# Patient Record
Sex: Female | Born: 1945
Health system: Southern US, Community
[De-identification: ages and names within clinical notes are randomized; demographics above are authoritative.]

## PROBLEM LIST (undated history)

## (undated) DIAGNOSIS — F419 Anxiety disorder, unspecified: Secondary | ICD-10-CM

## (undated) DIAGNOSIS — R350 Frequency of micturition: Secondary | ICD-10-CM

## (undated) DIAGNOSIS — E119 Type 2 diabetes mellitus without complications: Secondary | ICD-10-CM

## (undated) DIAGNOSIS — I10 Essential (primary) hypertension: Secondary | ICD-10-CM

## (undated) DIAGNOSIS — E039 Hypothyroidism, unspecified: Secondary | ICD-10-CM

## (undated) DIAGNOSIS — Z8709 Personal history of other diseases of the respiratory system: Secondary | ICD-10-CM

## (undated) DIAGNOSIS — R32 Unspecified urinary incontinence: Secondary | ICD-10-CM

## (undated) DIAGNOSIS — M1990 Unspecified osteoarthritis, unspecified site: Secondary | ICD-10-CM

## (undated) DIAGNOSIS — F32A Depression, unspecified: Secondary | ICD-10-CM

## (undated) DIAGNOSIS — Z8701 Personal history of pneumonia (recurrent): Secondary | ICD-10-CM

## (undated) DIAGNOSIS — F329 Major depressive disorder, single episode, unspecified: Secondary | ICD-10-CM

## (undated) DIAGNOSIS — E785 Hyperlipidemia, unspecified: Secondary | ICD-10-CM

## (undated) DIAGNOSIS — I639 Cerebral infarction, unspecified: Secondary | ICD-10-CM

## (undated) HISTORY — PX: TUMOR EXCISION: SHX421

## (undated) HISTORY — PX: ELBOW SURGERY: SHX618

## (undated) HISTORY — PX: KNEE ARTHROPLASTY: SHX992

## (undated) HISTORY — PX: SHOULDER SURGERY: SHX246

## (undated) HISTORY — PX: TRIGGER FINGER RELEASE: SHX641

## (undated) HISTORY — PX: BILATERAL CARPAL TUNNEL RELEASE: SHX6508

## (undated) HISTORY — PX: ABDOMINAL HYSTERECTOMY: SHX81

---

## 2003-02-17 ENCOUNTER — Encounter: Payer: Self-pay | Admitting: Orthopedic Surgery

## 2003-02-17 ENCOUNTER — Ambulatory Visit (HOSPITAL_COMMUNITY): Admission: RE | Admit: 2003-02-17 | Discharge: 2003-02-17 | Payer: Self-pay | Admitting: Orthopedic Surgery

## 2003-03-19 ENCOUNTER — Ambulatory Visit (HOSPITAL_BASED_OUTPATIENT_CLINIC_OR_DEPARTMENT_OTHER): Admission: RE | Admit: 2003-03-19 | Discharge: 2003-03-19 | Payer: Self-pay | Admitting: Orthopedic Surgery

## 2015-06-23 ENCOUNTER — Other Ambulatory Visit (HOSPITAL_COMMUNITY): Payer: Self-pay | Admitting: Interventional Radiology

## 2015-06-23 DIAGNOSIS — I771 Stricture of artery: Secondary | ICD-10-CM

## 2015-06-27 ENCOUNTER — Ambulatory Visit (HOSPITAL_COMMUNITY)
Admission: RE | Admit: 2015-06-27 | Discharge: 2015-06-27 | Disposition: A | Discharge status: Home / Self Care | Payer: Commercial Managed Care - HMO | Source: Ambulatory Visit | Attending: Interventional Radiology | Admitting: Interventional Radiology

## 2015-06-27 DIAGNOSIS — I771 Stricture of artery: Secondary | ICD-10-CM

## 2015-07-03 ENCOUNTER — Other Ambulatory Visit (HOSPITAL_COMMUNITY): Payer: Self-pay | Admitting: Interventional Radiology

## 2015-07-03 DIAGNOSIS — I6389 Other cerebral infarction: Secondary | ICD-10-CM

## 2015-07-03 DIAGNOSIS — I771 Stricture of artery: Secondary | ICD-10-CM

## 2015-07-10 ENCOUNTER — Encounter (HOSPITAL_COMMUNITY): Payer: Self-pay

## 2015-07-10 ENCOUNTER — Other Ambulatory Visit: Payer: Self-pay | Admitting: Physician Assistant

## 2015-07-10 ENCOUNTER — Encounter (HOSPITAL_COMMUNITY)
Admission: RE | Admit: 2015-07-10 | Discharge: 2015-07-10 | Disposition: A | Discharge status: Home / Self Care | Payer: Medicare HMO | Source: Ambulatory Visit | Attending: Interventional Radiology | Admitting: Interventional Radiology

## 2015-07-10 DIAGNOSIS — F329 Major depressive disorder, single episode, unspecified: Secondary | ICD-10-CM | POA: Insufficient documentation

## 2015-07-10 DIAGNOSIS — Z96651 Presence of right artificial knee joint: Secondary | ICD-10-CM | POA: Diagnosis not present

## 2015-07-10 DIAGNOSIS — I10 Essential (primary) hypertension: Secondary | ICD-10-CM | POA: Diagnosis not present

## 2015-07-10 DIAGNOSIS — Z01812 Encounter for preprocedural laboratory examination: Secondary | ICD-10-CM | POA: Insufficient documentation

## 2015-07-10 DIAGNOSIS — Z7902 Long term (current) use of antithrombotics/antiplatelets: Secondary | ICD-10-CM | POA: Diagnosis not present

## 2015-07-10 DIAGNOSIS — E039 Hypothyroidism, unspecified: Secondary | ICD-10-CM | POA: Diagnosis not present

## 2015-07-10 DIAGNOSIS — I6601 Occlusion and stenosis of right middle cerebral artery: Secondary | ICD-10-CM | POA: Diagnosis not present

## 2015-07-10 DIAGNOSIS — E785 Hyperlipidemia, unspecified: Secondary | ICD-10-CM | POA: Diagnosis not present

## 2015-07-10 DIAGNOSIS — Z01818 Encounter for other preprocedural examination: Secondary | ICD-10-CM | POA: Insufficient documentation

## 2015-07-10 DIAGNOSIS — Z79899 Other long term (current) drug therapy: Secondary | ICD-10-CM | POA: Diagnosis not present

## 2015-07-10 DIAGNOSIS — F419 Anxiety disorder, unspecified: Secondary | ICD-10-CM | POA: Diagnosis not present

## 2015-07-10 DIAGNOSIS — Z8673 Personal history of transient ischemic attack (TIA), and cerebral infarction without residual deficits: Secondary | ICD-10-CM | POA: Diagnosis not present

## 2015-07-10 DIAGNOSIS — E119 Type 2 diabetes mellitus without complications: Secondary | ICD-10-CM | POA: Diagnosis not present

## 2015-07-10 DIAGNOSIS — Z7982 Long term (current) use of aspirin: Secondary | ICD-10-CM | POA: Diagnosis not present

## 2015-07-10 HISTORY — DX: Anxiety disorder, unspecified: F41.9

## 2015-07-10 HISTORY — DX: Frequency of micturition: R35.0

## 2015-07-10 HISTORY — DX: Personal history of other diseases of the respiratory system: Z87.09

## 2015-07-10 HISTORY — DX: Depression, unspecified: F32.A

## 2015-07-10 HISTORY — DX: Major depressive disorder, single episode, unspecified: F32.9

## 2015-07-10 HISTORY — DX: Essential (primary) hypertension: I10

## 2015-07-10 HISTORY — DX: Unspecified urinary incontinence: R32

## 2015-07-10 HISTORY — DX: Type 2 diabetes mellitus without complications: E11.9

## 2015-07-10 HISTORY — DX: Unspecified osteoarthritis, unspecified site: M19.90

## 2015-07-10 HISTORY — DX: Hypothyroidism, unspecified: E03.9

## 2015-07-10 HISTORY — DX: Personal history of pneumonia (recurrent): Z87.01

## 2015-07-10 HISTORY — DX: Hyperlipidemia, unspecified: E78.5

## 2015-07-10 HISTORY — DX: Cerebral infarction, unspecified: I63.9

## 2015-07-10 LAB — URINALYSIS, ROUTINE W REFLEX MICROSCOPIC
BILIRUBIN URINE: NEGATIVE
Glucose, UA: NEGATIVE mg/dL
HGB URINE DIPSTICK: NEGATIVE
Ketones, ur: NEGATIVE mg/dL
Nitrite: NEGATIVE
PH: 5.5 (ref 5.0–8.0)
Protein, ur: NEGATIVE mg/dL
SPECIFIC GRAVITY, URINE: 1.027 (ref 1.005–1.030)

## 2015-07-10 LAB — PLATELET INHIBITION P2Y12: Platelet Function  P2Y12: 222 [PRU] (ref 194–418)

## 2015-07-10 LAB — BASIC METABOLIC PANEL
Anion gap: 8 (ref 5–15)
BUN: 12 mg/dL (ref 6–20)
CHLORIDE: 108 mmol/L (ref 101–111)
CO2: 23 mmol/L (ref 22–32)
CREATININE: 0.78 mg/dL (ref 0.44–1.00)
Calcium: 9.4 mg/dL (ref 8.9–10.3)
GFR calc Af Amer: >60 mL/min (ref >60)
GFR calc non Af Amer: >60 mL/min (ref >60)
GLUCOSE: 149 mg/dL — AB (ref 65–99)
POTASSIUM: 3.9 mmol/L (ref 3.5–5.1)
SODIUM: 139 mmol/L (ref 135–145)

## 2015-07-10 LAB — URINE MICROSCOPIC-ADD ON: RBC / HPF: NONE SEEN RBC/hpf (ref 0–5)

## 2015-07-10 LAB — GLUCOSE, CAPILLARY: GLUCOSE-CAPILLARY: 167 mg/dL — AB (ref 65–99)

## 2015-07-10 LAB — CBC
HEMATOCRIT: 40.2 % (ref 36.0–46.0)
Hemoglobin: 12.9 g/dL (ref 12.0–15.0)
MCH: 30.1 pg (ref 26.0–34.0)
MCHC: 32.1 g/dL (ref 30.0–36.0)
MCV: 93.9 fL (ref 78.0–100.0)
PLATELETS: 331 10*3/uL (ref 150–400)
RBC: 4.28 MIL/uL (ref 3.87–5.11)
RDW: 14.6 % (ref 11.5–15.5)
WBC: 15.1 10*3/uL — ABNORMAL HIGH (ref 4.0–10.5)

## 2015-07-10 LAB — PROTIME-INR
INR: 1.01 (ref 0.00–1.49)
Prothrombin Time: 13.5 seconds (ref 11.6–15.2)

## 2015-07-10 LAB — APTT: aPTT: 28 seconds (ref 24–37)

## 2015-07-10 MED ORDER — SODIUM CHLORIDE 0.9 % IV SOLN: INTRAVENOUS | Status: DC

## 2015-07-10 NOTE — Pre-Procedure Instructions (Addendum)
Maria Mueller  07/10/2015      [facility] [address] [name], [place] - [address] AT [facility] [address] [place] [address] Phone: [phone] Fax: [phone]    Your procedure is scheduled on Wednesday, December 28th, 2016.  Report to [facility] Admitting at 7:00 A.M.  Call this number if you have problems the morning of surgery:  [phone]   Remember:  Do not eat food or drink liquids after midnight.   Take these medicines the morning of surgery with A SIP OF WATER: Aspirin, Clopidogrel (Plavix), Cyclobenzaprine (Flexeril), Hydrocodone-acetaminophen (Norco/Vicodin), Levothyroxine (Synthroid), Sertraline (Zoloft).  Stop taking: Meloxicam (Mobic), Omega-3 Fatty Acids (Fish oil), Ibuprofen, Advil, Motrin, BC's, Goody's, all herbal medications, and all vitamins.    What do I do about my diabetes medications?   Do not take oral diabetes medicines (pills) the morning of surgery.    Do not wear jewelry, make-up or nail polish.  Do not wear lotions, powders, or perfumes.  You may wear deodorant.  Do not shave 48 hours prior to surgery.    Do not bring valuables to the hospital.  [facility] is not responsible for any belongings or valuables.  Contacts, dentures or bridgework may not be worn into surgery.  Leave your suitcase in the car.  After surgery it may be brought to your room.  For patients admitted to the hospital, discharge time will be determined by your treatment team.  Patients discharged the day of surgery will not be allowed to drive home.   Special instructions:  See attached.   Please read over the following fact sheets that you were given. Pain Booklet, Coughing and Deep Breathing and Surgical Site Infection Prevention      How to Manage Your Diabetes Before Surgery   Why is it important to control my blood sugar before and after surgery?   Improving blood sugar levels  before and after surgery helps healing and can limit problems.  A way of improving blood sugar control is eating a healthy diet by:  - Eating less sugar and carbohydrates  - Increasing activity/exercise  - Talk with your doctor about reaching your blood sugar goals  High blood sugars (greater than 180 mg/dL) can raise your risk of infections and slow down your recovery so you will need to focus on controlling your diabetes during the weeks before surgery.  Make sure that the doctor who takes care of your diabetes knows about your planned surgery including the date and location.  How do I manage my blood sugars before surgery?   Check your blood sugar at least 4 times a day, 2 days before surgery to make sure that they are not too high or low.   Check your blood sugar the morning of your surgery when you wake up and every 2  hours until you get to the Short-Stay unit.  If your blood sugar is less than 70 mg/dL, you will need to treat for low blood sugar by:  Treat a low blood sugar (less than 70 mg/dL) with 1/2 cup of clear juice (cranberry or apple), 4 glucose tablets, OR glucose gel.  Recheck blood sugar in 15 minutes after treatment (to make sure it is greater than 70 mg/dL).  If blood sugar is not greater than 70 mg/dL on re-check, call [phone] for further instructions.   Report your blood sugar to the Short-Stay nurse when you get  to Short-Stay.  References:  [facility], 2007 "How to Manage your Diabetes Before and After Surgery".

## 2015-07-10 NOTE — Progress Notes (Signed)
PCP - Dr. Shary [facility]amp[facility]isso at [facility] [place] Cardiologist - denies  [facility]KG & [facility]R - [facility]  Echo/stress test/Cardiac cath - denies  Patient denies chest pain and shortness of breath at PAT appointment.    Patient states that she checks her blood sugar every morning and that her fasting blood sugar ranges from 80 - 110's.    Patient states the fell (tripped on step) on 07/07/2015 and was [facility] and received 6 staples in the left, posterior head.  Patient states that staples will be removed prior to procedure.  All [facility].

## 2015-07-11 ENCOUNTER — Encounter (HOSPITAL_COMMUNITY): Payer: Self-pay | Admitting: Anesthesiology

## 2015-07-11 ENCOUNTER — Encounter (HOSPITAL_COMMUNITY): Payer: Self-pay | Admitting: Vascular Surgery

## 2015-07-11 ENCOUNTER — Telehealth (HOSPITAL_COMMUNITY): Payer: Self-pay | Admitting: Interventional Radiology

## 2015-07-11 ENCOUNTER — Encounter (HOSPITAL_COMMUNITY): Payer: Self-pay

## 2015-07-11 LAB — HEMOGLOBIN A1C
HEMOGLOBIN A1C: 7.3 % — AB (ref 4.8–5.6)
MEAN PLASMA GLUCOSE: 163 mg/dL

## 2015-07-11 NOTE — Telephone Encounter (Signed)
Called pt, left VM for her to call me back concerning a recent fall. She is scheduled for surgery with Dr. Corliss Skainseveshwar on 07/16/15 but now has 6 staples in her head from the fall. I will speak with Deveshwar and try to call the pt back to discuss further. JM

## 2015-07-11 NOTE — Progress Notes (Signed)
Anesthesia Chart Review: Patient is a 69 year old female scheduled for angioplasty with stenting on 07/16/15 by Dr. Corliss Skainseveshwar. She has a history of CVA 06/20/15 [facility]) with MRA revealing a tight focal stenosis of the right MCA proximally which probably caused her CVA so intervention was recommended.  History includes CVA 06/20/15 (no residual deficits), fall (tripped) with scalp laceration s/p 6 staples 07/07/15 [facility]), smoking, DM2, hypothyroidism, anxiety, depression, HTN, dyslipidemia, right TKA, abdominal tumor excision, right elbow and shoulder surgeries. BMI is consistent with mild obesity.   PCP is Dr. Shary [facility]ampGrisso at [facility] [place].  Meds include ASA 81 mg, Lipitor, Plavix, Flexeril, levothyroxine, losartan, metformin, fish oil, Zoloft, Septra DS.  07/07/15 EKG [facility]): NSR.  06/20/15 Echo [facility]): Mild concentric LVH with normal Lv systolic function, no segmental abnormality. Minimal aortic sclerosis with trace AR. Mitral annular calcium and leaflet thickening, mild MR, mild LA dilatation. Normal RV, mild TR, mild RAE, normal pulmonary artery pressure suggested. No PFO by Doppler. No intracardiac source for emboli identified.   06/20/15 Echo [facility]): Impression: Mild right and no appreciable left carotid plaque, with no hemodynamically significant ICA stenosis by Doppler criteria (0-49%). Normal antegrade flow in both vertebral arteries.    07/07/15 CXR [facility]): Impression: No acute findings, but evidence of a possible nodular opacity over there RLL that is not present on prior studies. Consider chest CT.   07/07/15 Chest CT w/o contrast [facility]): 1. No acute abnormality in the chest.  2. Multiple bilateral small pulmonary nodules measuring no more than 4 mm which remain unchanged compared to prior imaging from August 2008 consistent with a benign process such as prior infectious/inflammatory or  granulomatous disease.  3. Enlarged main and central pulmonary arteries as can be seen in the setting of pulmonary arterial hypertension.  4. Benign left adrenal adenoma.  5. High attenuation material layering within the incompletely imaged gallbladder suggests the presence of sludge and/or small stones.   07/07/15 Head CT w/o contrast [facility]): Infarct in the right basal ganglia region with evolution compared to recent prior studies. No more recent infarct identified. No hemorrhage or mass effect. No extra-axial flud collection. There are air-fluid levels in each sphenoid sinus. Question acute sinusitis verus hemorrhage. No fractures are identified in the visualized maxillofacial regions, and no intraorbital lesions are identified. Mastoids bilaterally are clear.   Preoperative labs noted. A1C 7.3. p2y12 was 222. WBC 15.1K. [facility] who will forward to Dr. Corliss Skainseveshwar to review for any recommendations. She is on Septra, I believe for a UTI.  Patient with recent CVA felt due to tight right MCA stenosis with intervention recommended. Dr. Fatima Sangereveshwar's office aware of 07/07/15 fall with scalp staples. Reportedly, patient to have them removed prior to her procedure. If he feels labs are acceptable and otherwise no acute changes then I would anticipate that she could proceed as planned.  Maria Ochsllison Gitty Osterlund, PA-C [facility] Short Stay [facility]/Anesthesiology Phone [phone] 07/11/2015 12:06 PM

## 2015-07-16 ENCOUNTER — Ambulatory Visit (HOSPITAL_COMMUNITY)
Admission: RE | Admit: 2015-07-16 | Discharge: 2015-07-16 | Disposition: A | Discharge status: Home / Self Care | Payer: Commercial Managed Care - HMO | Source: Ambulatory Visit | Attending: Interventional Radiology | Admitting: Interventional Radiology

## 2015-07-16 ENCOUNTER — Encounter (HOSPITAL_COMMUNITY): Payer: Self-pay

## 2015-07-16 ENCOUNTER — Ambulatory Visit (HOSPITAL_COMMUNITY)
Admission: RE | Admit: 2015-07-16 | Discharge: 2015-07-16 | Disposition: A | Discharge status: Home / Self Care | Payer: Medicare HMO | Source: Ambulatory Visit | Attending: Interventional Radiology | Admitting: Interventional Radiology

## 2015-07-16 ENCOUNTER — Encounter (HOSPITAL_COMMUNITY)
Admission: RE | Disposition: A | Discharge status: Home / Self Care | Payer: Self-pay | Source: Ambulatory Visit | Attending: Interventional Radiology

## 2015-07-16 ENCOUNTER — Encounter (HOSPITAL_COMMUNITY): Payer: Self-pay | Admitting: *Deleted

## 2015-07-16 DIAGNOSIS — I6601 Occlusion and stenosis of right middle cerebral artery: Secondary | ICD-10-CM | POA: Diagnosis not present

## 2015-07-16 DIAGNOSIS — I771 Stricture of artery: Secondary | ICD-10-CM | POA: Insufficient documentation

## 2015-07-16 DIAGNOSIS — Z8673 Personal history of transient ischemic attack (TIA), and cerebral infarction without residual deficits: Secondary | ICD-10-CM | POA: Insufficient documentation

## 2015-07-16 DIAGNOSIS — Z539 Procedure and treatment not carried out, unspecified reason: Secondary | ICD-10-CM | POA: Diagnosis not present

## 2015-07-16 DIAGNOSIS — E669 Obesity, unspecified: Secondary | ICD-10-CM | POA: Insufficient documentation

## 2015-07-16 DIAGNOSIS — I639 Cerebral infarction, unspecified: Secondary | ICD-10-CM | POA: Insufficient documentation

## 2015-07-16 HISTORY — PX: RADIOLOGY WITH ANESTHESIA: SHX6223

## 2015-07-16 LAB — GLUCOSE, CAPILLARY: Glucose-Capillary: 111 mg/dL — ABNORMAL HIGH (ref 65–99)

## 2015-07-16 LAB — PLATELET INHIBITION P2Y12: PLATELET FUNCTION P2Y12: 3 [PRU] — AB (ref 194–418)

## 2015-07-16 SURGERY — RADIOLOGY WITH ANESTHESIA
Anesthesia: General

## 2015-07-16 MED ORDER — SODIUM CHLORIDE 0.9 % IV SOLN: INTRAVENOUS | Status: DC

## 2015-07-16 MED ORDER — LACTATED RINGERS IV SOLN: INTRAVENOUS | Status: DC

## 2015-07-16 NOTE — Anesthesia Preprocedure Evaluation (Deleted)
Anesthesia Evaluation  Patient identified by MRN, date of birth, ID band Patient awake    Reviewed: Allergy & Precautions, H&P , NPO status , Patient's Chart, lab work & pertinent test results  History of Anesthesia Complications Negative for: history of anesthetic complications  Airway Mallampati: II  TM Distance: >3 FB Neck ROM: full    Dental no notable dental hx.    Pulmonary neg pulmonary ROS, Current Smoker,    Pulmonary exam normal breath sounds clear to auscultation       Cardiovascular hypertension, negative cardio ROS Normal cardiovascular exam Rhythm:regular Rate:Normal     Neuro/Psych negative neurological ROS     GI/Hepatic negative GI ROS, Neg liver ROS,   Endo/Other  negative endocrine ROSdiabetes  Renal/GU negative Renal ROS     Musculoskeletal   Abdominal   Peds  Hematology negative hematology ROS (+)   Anesthesia Other Findings   Reproductive/Obstetrics negative OB ROS                             Anesthesia Physical Anesthesia Plan  ASA: III  Anesthesia Plan: General   Post-op Pain Management:    Induction: Intravenous  Airway Management Planned: Oral ETT  Additional Equipment: Arterial line  Intra-op Plan:   Post-operative Plan: Extubation in OR  Informed Consent: I have reviewed the patients History and Physical, chart, labs and discussed the procedure including the risks, benefits and alternatives for the proposed anesthesia with the patient or authorized representative who has indicated his/her understanding and acceptance.   Dental Advisory Given  Plan Discussed with: Anesthesiologist and CRNA  Anesthesia Plan Comments: (Mac sedation until GA needed)        Anesthesia Quick Evaluation

## 2015-07-16 NOTE — H&P (Signed)
Chief Complaint: Patient was seen in consultation today for R MCA stenosis at the request of Dr Feliciana Rossetti  Referring Physician(s): Dr Feliciana Rossetti  History of Present Illness: Maria Mueller is a 69 y.o. female   The patient is a 69 year old, right handed lady who had been referred for evaluation of symptomatic right middle cerebral artery stenosis resulting in her recent right cerebral hemispheric basal ganglia stroke. Was consulted with Dr Corliss Skains 07/09/2015 regarding evaluation and possible treatment of R MCA stenosis  Pt has been using ASA 81 mg and Plavix since hospitalization at [facility] 07/03/2015 No symptoms per pt Continues to smoke 1ppd  Now scheduled for cerebral arteriogram with possible revascularization of Right Middle Cerebral Artery stenosis   Past Medical History  Diagnosis Date  . Diabetes mellitus without complication (HCC)     type II  . Hypothyroidism     synthroid  . Hypertension   . Dyslipidemia   . History of pneumonia   . History of bronchitis   . Anxiety   . Depression   . Urinary frequency   . Urinary incontinence   . Arthritis   . Stroke (HCC)     06/20/15, no deficits    Past Surgical History  Procedure Laterality Date  . Bilateral carpal tunnel release    . Elbow surgery Right   . Shoulder surgery Right   . Knee arthroplasty Right   . Tumor excision      abdominal  . Abdominal hysterectomy      Allergies: Review of patient's allergies indicates no known allergies.  Medications: Prior to Admission medications   Medication Sig Start Date End Date Taking? Authorizing Provider  aspirin 81 MG tablet Take 81 mg by mouth daily.    Historical Provider, MD  atorvastatin (LIPITOR) 40 MG tablet Take 40 mg by mouth 2 (two) times daily.    Historical Provider, MD  cholecalciferol (VITAMIN D) 1000 UNITS tablet Take 1,000 Units by mouth daily.    Historical Provider, MD  clopidogrel (PLAVIX) 75 MG tablet Take 75 mg by mouth  daily.    Historical Provider, MD  cyclobenzaprine (FLEXERIL) 10 MG tablet Take 10 mg by mouth 3 (three) times daily as needed for muscle spasms.    Historical Provider, MD  HYDROcodone-acetaminophen (NORCO/VICODIN) 5-325 MG tablet Take 1 tablet by mouth every 6 (six) hours as needed for moderate pain.    Historical Provider, MD  levothyroxine (SYNTHROID, LEVOTHROID) 75 MCG tablet Take 75 mcg by mouth daily before breakfast.    Historical Provider, MD  losartan (COZAAR) 50 MG tablet Take 50 mg by mouth daily.    Historical Provider, MD  meloxicam (MOBIC) 15 MG tablet Take 15 mg by mouth daily.    Historical Provider, MD  metFORMIN (GLUMETZA) 500 MG (MOD) 24 hr tablet Take 500 mg by mouth 2 (two) times daily with a meal.    Historical Provider, MD  Omega-3 Fatty Acids (FISH OIL) 1000 MG CAPS Take 1 capsule by mouth daily.    Historical Provider, MD  sertraline (ZOLOFT) 100 MG tablet Take 100 mg by mouth 2 (two) times daily.    Historical Provider, MD  sulfamethoxazole-trimethoprim (BACTRIM DS,SEPTRA DS) 800-160 MG tablet Take 1 tablet by mouth 2 (two) times daily.    Historical Provider, MD  vitamin C (ASCORBIC ACID) 500 MG tablet Take 500 mg by mouth daily.    Historical Provider, MD     History reviewed. No pertinent family history.  Social History  Social History  . Marital Status: Married    Spouse Name: N/A  . Number of Children: N/A  . Years of Education: N/A   Social History Main Topics  . Smoking status: Current Every Day Smoker -- 1.00 packs/day    Types: Cigarettes  . Smokeless tobacco: None     Comment: smoking cessation info given  . Alcohol Use: No  . Drug Use: No  . Sexual Activity: Not Asked   Other Topics Concern  . None   Social History Narrative     Review of Systems: A 12 point ROS discussed and pertinent positives are indicated in the HPI above.  All other systems are negative.  Review of Systems  Constitutional: Negative for fever, activity change,  appetite change and fatigue.  HENT: Negative for tinnitus, trouble swallowing and voice change.   Eyes: Negative for visual disturbance.  Respiratory: Negative for cough, chest tightness and shortness of breath.   Gastrointestinal: Negative for nausea and vomiting.  Genitourinary: Negative for difficulty urinating.  Musculoskeletal: Positive for gait problem. Negative for back pain.       Uses cane secondary Rt knee replacement  Neurological: Negative for dizziness, tremors, seizures, syncope, facial asymmetry, speech difficulty, weakness, light-headedness, numbness and headaches.  Psychiatric/Behavioral: Negative for behavioral problems, confusion and agitation.    Vital Signs: There were no vitals taken for this visit.  Physical Exam  Constitutional: She is oriented to person, place, and time. She appears well-nourished.  HENT:  Pt fell 07/07/2015 and hit left side of posterior head Requiring 6 staples  Was seen at [facility] ED 12/19 - all imaging neg per pt  Staples removed yesterday  Eyes: EOM are normal.  Neck: Normal range of motion.  Cardiovascular: Normal rate, regular rhythm and normal heart sounds.   No murmur heard. Pulmonary/Chest: Effort normal and breath sounds normal. She has no wheezes.  Abdominal: Soft. Bowel sounds are normal. There is no tenderness.  Musculoskeletal: Normal range of motion.  Neurological: She is alert and oriented to person, place, and time.  Skin: Skin is warm and dry.  Psychiatric: She has a normal mood and affect. Her behavior is normal. Judgment and thought content normal.  Nursing note and vitals reviewed.   Mallampati Score:  MD Evaluation Airway: WNL Heart: WNL Abdomen: WNL Chest/ Lungs: WNL ASA  Classification: 2 Mallampati/Airway Score: Two  Imaging: Ir Radiologist Eval & Mgmt  07/07/2015  EXAM: NEW PATIENT OFFICE VISIT CHIEF COMPLAINT: Recovering from right sided cerebral hemispheric stroke approximately a week ago.  Abnormal MRI/MRA of the brain. Current Pain Level: 1-10 HISTORY OF PRESENT ILLNESS: The patient is a 69 year old, right handed lady who has been referred for evaluation of symptomatic right middle cerebral artery stenosis resulting in her recent right cerebral hemispheric basal ganglia stroke. The patient is accompanied by her husband. Apparently a week ago, the patient woke up in the early hours of morning to use the bathroom. On her way back she realized she had severe difficulty in moving her left leg and possibly her left arm associated with dizziness. The patient did not seek attention at that time. This was associated with numbness. The patient went off to bad and at 8 o'clock in the morning, she felt that her left leg and left arm were much weaker. An ambulance was then call and the patient was admitted at [facility]. Workup there revealed that the patient had developed a right basal ganglia putaminal ischemic event. Additionally an MRA revealed tight focal  stenoses of the right middle cerebral artery M1 segment. During the next day or two, the patient almost returned to complete neurological function and was discharged on Plavix. The patient reports no TIA-like symptoms prior to the actual event 7 days ago. At the time, the patient complained of no speech difficulties, no visual aberrations, no diplopia. She did not have any headaches or loss of consciousness or seizure-like activity at the time. Presently she is almost back to her normal self taking care of herself and eating with equal dexterity in both her right and left upper extremities. The patient reports returning to almost normal ambulation. She still uses a cane on account of her recent right knee arthroplasty. She denies any recent chills or fever or rigors. Her appetite is back to normal, her weight is steady. She denies any chest pain, shortness of breath, hemoptysis or wheezing or difficulty breathing. She denies any abdominal pain,  nausea, vomiting, diarrhea or constipation. She denies any dysuria, frequency, hematuria or polyuria. Past Medical History: Significant for arthritis, depression, high blood pressure, diabetes and thyroid disease. Previous surgeries: In October of this year underwent right knee arthroplasty. Medications: Atorvastatin. Plavix. Levaquin. Probiotic. Alprazolam. Cholecalciferol. Hydrocodone/acetaminophen combination. Synthroid. Losartan. Metformin. Sertraline. Allergies: No known allergies. Social History: Patient is married, lives with were husband who is alive and well. Has one son who is healthy. Three siblings alive and well. Patient smokes a pack a day, has done so since age 69. Denies drinking alcohol or using illicit chemicals. Family History: Significant for headaches, heart problems, high blood pressure, diabetes, COPD, stroke. REVIEW OF SYSTEMS: As above. PHYSICAL EXAMINATION: Patient in no acute distress. Speech and comprehension normal. No lateralizing cranial nerve, motor, sensory or coordination difficulties. Patient walks with a slightly antalgic gait with the help of a cane. ASSESSMENT AND PLAN: The patient's MRI/MRA performed approximately a week ago was reviewed with her and her spouse. The area of interest of her tight focal stenosis of the right middle cerebral artery proximally, probably the cause of her stroke, was brought to their attention. The option of her cerebral arteriogram to accurately verify the degree of narrowing, with the possibility of endovascular revascularization with angioplasty and/or stenting to prevent future strokes in this distribution was discussed in detail. The procedure, the risks, the benefits and the alternatives were all reviewed. The patient will scheduled to have cerebral arteriogram with possible endovascular revascularization of the right middle cerebral artery M1 segment with anesthesia. In the meantime, she has been asked to continue taking her medications,  Plavix and also add 81 mg of aspirin. She has been asked to maintain adequate hydration by drinking water. Questions were answered to their satisfaction. The were asked to call should they have any concerns or questions. Electronically Signed   By: Julieanne CottonSanjeev  Deveshwar M.D.   On: 06/27/2015 11:17    Labs:  CBC:  Recent Labs  07/10/15 1351  WBC 15.1*  HGB 12.9  HCT 40.2  PLT 331    COAGS:  Recent Labs  07/10/15 1351  INR 1.01  APTT 28    BMP:  Recent Labs  07/10/15 1351  NA 139  K 3.9  CL 108  CO2 23  GLUCOSE 149*  BUN 12  CALCIUM 9.4  CREATININE 0.78  GFRNONAA >60  GFRAA >60    LIVER FUNCTION TESTS: No results for input(s): BILITOT, AST, ALT, ALKPHOS, PROT, ALBUMIN in the last 8760 hours.  TUMOR MARKERS: No results for input(s): AFPTM, CEA, CA199, CHROMGRNA in the  last 8760 hours.  Assessment and Plan:  CVA 07/03/2015 Evaluated and treated at [facility] Referred to Dr Corliss Skains for evaluation and possible treatment of discovered R MCA stenosis Now scheduled for cerebral arteriogram with possible angioplasty/stent placement Risks and Benefits discussed with the patient including, but not limited to bleeding, infection, vascular injury, contrast induced renal failure, stroke or even death. All of the patient's questions were answered, patient is agreeable to proceed. Consent signed and in chart.  Pt and family aware if intervention is performed--pt will be admitted to Neuro ICU overnight with plan to be discharged in am.  All are agreeable.  Thank you for this interesting consult.  I greatly enjoyed meeting CHARENE Mueller and look forward to participating in their care.  A copy of this report was sent to the requesting provider on this date.  Signed: Gaurav Baldree A 07/16/2015, 8:03 AM   I spent a total of  30 Minutes   in face to face in clinical consultation, greater than 50% of which was counseling/coordinating care for cerebral arteriogram  with poss revascularization RMCA stenosis

## 2015-07-17 ENCOUNTER — Encounter (HOSPITAL_COMMUNITY): Payer: Self-pay | Admitting: Interventional Radiology

## 2015-07-28 ENCOUNTER — Telehealth (HOSPITAL_COMMUNITY): Payer: Self-pay | Admitting: Interventional Radiology

## 2015-07-28 NOTE — Telephone Encounter (Signed)
Called pt to discuss rescheduling her intervention with Deveshwar. She states that her [facility] and she will call me back ASAP to discuss possible dates. I also told her that she needs to come by the hospital as soon as she has time one day in the near future for a P2Y12 check. She states understanding and is in agreement w/ this plan of care. JM

## 2015-07-28 NOTE — Telephone Encounter (Signed)
Called pt, left VM for her to call to reschedule her intervention w/ Deveshwar. JM

## 2015-08-25 ENCOUNTER — Other Ambulatory Visit: Payer: Self-pay | Admitting: General Surgery

## 2015-08-25 LAB — PLATELET INHIBITION P2Y12: Platelet Function  P2Y12: 48 [PRU] — ABNORMAL LOW (ref 194–418)

## 2015-08-27 ENCOUNTER — Encounter (HOSPITAL_COMMUNITY): Payer: Self-pay

## 2015-08-27 NOTE — Pre-Procedure Instructions (Signed)
Maria Mueller  08/27/2015      [facility] [name], [place] - [address] AT [facility] OF [address] [place] [address] Phone: [phone] Fax: [phone]    Your procedure is scheduled on Mon, Feb 13 @ 9:00 AM  Report to [facility] Admitting at 7:00 AM  Call this number if you have problems the morning of surgery:  [phone]   Remember:  Do not eat food or drink liquids after midnight.  Take these medicines the morning of surgery with A SIP OF WATER Pain Pill(if needed),Synthroid(Levothyroxine),Zoloft(Sertraline),Aspirin,Plavix,(Clopidogrel),and BactrimISulfamethoxazole)              Stop taking your Meloxicam. No Goody's,BC's,Aleve,Ibuprofen,Motrin,Advil,Fish Oil,or any Herbal Medications.    Do not wear jewelry, make-up or nail polish.  Do not wear lotions, powders, or perfumes.  You may wear deodorant.  Do not shave 48 hours prior to surgery.    Do not bring valuables to the hospital.  [facility] is not responsible for any belongings or valuables.  Contacts, dentures or bridgework may not be worn into surgery.  Leave your suitcase in the car.  After surgery it may be brought to your room.  For patients admitted to the hospital, discharge time will be determined by your treatment team.  Patients discharged the day of surgery will not be allowed to drive home.    Special instructions:   [place] - Preparing for Surgery  Before surgery, you can play an important role.  Because skin is not sterile, your skin needs to be as free of germs as possible.  You can reduce the number of germs on you skin by washing with CHG (chlorahexidine gluconate) soap before surgery.  CHG is an antiseptic cleaner which kills germs and bonds with the skin to continue killing germs even after washing.  Please DO NOT use if you have an allergy to CHG or antibacterial soaps.  If your skin becomes reddened/irritated  stop using the CHG and inform your nurse when you arrive at Short Stay.  Do not shave (including legs and underarms) for at least 48 hours prior to the first CHG shower.  You may shave your face.  Please follow these instructions carefully:   1.  Shower with CHG Soap the night before surgery and the                                morning of Surgery.  2.  If you choose to wash your hair, wash your hair first as usual with your       normal shampoo.  3.  After you shampoo, rinse your hair and body thoroughly to remove the                      Shampoo.  4.  Use CHG as you would any other liquid soap.  You can apply chg directly       to the skin and wash gently with scrungie or a clean washcloth.  5.  Apply the CHG Soap to your body ONLY FROM THE NECK DOWN.        Do not use on open wounds or open sores.  Avoid contact with your eyes,       ears, mouth and genitals (private parts).  Wash genitals (private parts)  with your normal soap.  6.  Wash thoroughly, paying special attention to the area where your surgery        will be performed.  7.  Thoroughly rinse your body with warm water from the neck down.  8.  DO NOT shower/wash with your normal soap after using and rinsing off       the CHG Soap.  9.  Pat yourself dry with a clean towel.            10.  Wear clean pajamas.            11.  Place clean sheets on your bed the night of your first shower and do not        sleep with pets.  Day of Surgery  Do not apply any lotions/deoderants the morning of surgery.  Please wear clean clothes to the hospital/surgery center.   Please read over the following fact sheets that you were given. Pain Booklet, Coughing and Deep Breathing and Surgical Site Infection Prevention

## 2015-08-28 ENCOUNTER — Other Ambulatory Visit: Payer: Self-pay | Admitting: Radiology

## 2015-08-28 ENCOUNTER — Encounter (HOSPITAL_COMMUNITY)
Admission: RE | Admit: 2015-08-28 | Discharge: 2015-08-28 | Disposition: A | Discharge status: Home / Self Care | Payer: PPO | Source: Ambulatory Visit | Attending: Interventional Radiology | Admitting: Interventional Radiology

## 2015-08-28 ENCOUNTER — Encounter (HOSPITAL_COMMUNITY): Payer: Self-pay

## 2015-08-28 DIAGNOSIS — F17200 Nicotine dependence, unspecified, uncomplicated: Secondary | ICD-10-CM | POA: Diagnosis not present

## 2015-08-28 DIAGNOSIS — Z8673 Personal history of transient ischemic attack (TIA), and cerebral infarction without residual deficits: Secondary | ICD-10-CM | POA: Diagnosis not present

## 2015-08-28 DIAGNOSIS — Z01818 Encounter for other preprocedural examination: Secondary | ICD-10-CM | POA: Insufficient documentation

## 2015-08-28 DIAGNOSIS — E119 Type 2 diabetes mellitus without complications: Secondary | ICD-10-CM | POA: Diagnosis not present

## 2015-08-28 DIAGNOSIS — F329 Major depressive disorder, single episode, unspecified: Secondary | ICD-10-CM | POA: Insufficient documentation

## 2015-08-28 DIAGNOSIS — Z01812 Encounter for preprocedural laboratory examination: Secondary | ICD-10-CM | POA: Insufficient documentation

## 2015-08-28 DIAGNOSIS — Z7982 Long term (current) use of aspirin: Secondary | ICD-10-CM | POA: Insufficient documentation

## 2015-08-28 DIAGNOSIS — I6601 Occlusion and stenosis of right middle cerebral artery: Secondary | ICD-10-CM | POA: Diagnosis not present

## 2015-08-28 DIAGNOSIS — F419 Anxiety disorder, unspecified: Secondary | ICD-10-CM | POA: Diagnosis not present

## 2015-08-28 DIAGNOSIS — E785 Hyperlipidemia, unspecified: Secondary | ICD-10-CM | POA: Diagnosis not present

## 2015-08-28 DIAGNOSIS — Z7984 Long term (current) use of oral hypoglycemic drugs: Secondary | ICD-10-CM | POA: Diagnosis not present

## 2015-08-28 DIAGNOSIS — I10 Essential (primary) hypertension: Secondary | ICD-10-CM | POA: Diagnosis not present

## 2015-08-28 DIAGNOSIS — Z7902 Long term (current) use of antithrombotics/antiplatelets: Secondary | ICD-10-CM | POA: Diagnosis not present

## 2015-08-28 DIAGNOSIS — Z79899 Other long term (current) drug therapy: Secondary | ICD-10-CM | POA: Insufficient documentation

## 2015-08-28 DIAGNOSIS — E039 Hypothyroidism, unspecified: Secondary | ICD-10-CM | POA: Insufficient documentation

## 2015-08-28 DIAGNOSIS — Z96651 Presence of right artificial knee joint: Secondary | ICD-10-CM | POA: Insufficient documentation

## 2015-08-28 LAB — GLUCOSE, CAPILLARY: Glucose-Capillary: 125 mg/dL — ABNORMAL HIGH (ref 65–99)

## 2015-08-28 LAB — CBC WITH DIFFERENTIAL/PLATELET
Basophils Absolute: 0 10*3/uL (ref 0.0–0.1)
Basophils Relative: 1 %
EOS ABS: 0.3 10*3/uL (ref 0.0–0.7)
Eosinophils Relative: 4 %
HEMATOCRIT: 38.7 % (ref 36.0–46.0)
HEMOGLOBIN: 12.4 g/dL (ref 12.0–15.0)
LYMPHS ABS: 1.6 10*3/uL (ref 0.7–4.0)
LYMPHS PCT: 21 %
MCH: 30 pg (ref 26.0–34.0)
MCHC: 32 g/dL (ref 30.0–36.0)
MCV: 93.5 fL (ref 78.0–100.0)
MONOS PCT: 9 %
Monocytes Absolute: 0.7 10*3/uL (ref 0.1–1.0)
NEUTROS ABS: 5.1 10*3/uL (ref 1.7–7.7)
NEUTROS PCT: 65 %
Platelets: 220 10*3/uL (ref 150–400)
RBC: 4.14 MIL/uL (ref 3.87–5.11)
RDW: 15.2 % (ref 11.5–15.5)
WBC: 7.7 10*3/uL (ref 4.0–10.5)

## 2015-08-28 LAB — COMPREHENSIVE METABOLIC PANEL
ALK PHOS: 181 U/L — AB (ref 38–126)
ALT: 130 U/L — AB (ref 14–54)
AST: 96 U/L — ABNORMAL HIGH (ref 15–41)
Albumin: 3.4 g/dL — ABNORMAL LOW (ref 3.5–5.0)
Anion gap: 10 (ref 5–15)
BILIRUBIN TOTAL: 0.7 mg/dL (ref 0.3–1.2)
BUN: 10 mg/dL (ref 6–20)
CALCIUM: 8.7 mg/dL — AB (ref 8.9–10.3)
CO2: 25 mmol/L (ref 22–32)
Chloride: 105 mmol/L (ref 101–111)
Creatinine, Ser: 0.72 mg/dL (ref 0.44–1.00)
GFR calc Af Amer: >60 mL/min (ref >60)
GFR calc non Af Amer: >60 mL/min (ref >60)
Glucose, Bld: 160 mg/dL — ABNORMAL HIGH (ref 65–99)
Potassium: 3.6 mmol/L (ref 3.5–5.1)
SODIUM: 140 mmol/L (ref 135–145)
TOTAL PROTEIN: 6.5 g/dL (ref 6.5–8.1)

## 2015-08-28 LAB — PROTIME-INR
INR: 0.98 (ref 0.00–1.49)
PROTHROMBIN TIME: 13.2 s (ref 11.6–15.2)

## 2015-08-28 LAB — PLATELET INHIBITION P2Y12: Platelet Function  P2Y12: 7 [PRU] — ABNORMAL LOW (ref 194–418)

## 2015-08-28 LAB — APTT: aPTT: 29 seconds (ref 24–37)

## 2015-08-28 NOTE — Progress Notes (Signed)
Cardiologist  Medical Md  Echo  Stress test  Heart cath  EKG  CXR 

## 2015-08-29 ENCOUNTER — Telehealth: Payer: Self-pay | Admitting: Radiology

## 2015-08-29 ENCOUNTER — Encounter (HOSPITAL_COMMUNITY): Payer: Self-pay | Admitting: Vascular Surgery

## 2015-08-29 ENCOUNTER — Other Ambulatory Visit: Payer: Self-pay | Admitting: Radiology

## 2015-08-29 NOTE — Progress Notes (Signed)
Anesthesia Chart Review: Patient is a 70 year old female scheduled for angioplasty with stenting on 09/01/15 by Dr. Estanislado Pandy. She has a history of CVA 06/20/15 [facility]) with MRA revealing a tight focal stenosis of the right MCA proximally which probably caused her CVA so intervention was recommended. Procedure was initially scheduled for 07/16/15, but was rescheduled for unclear reasons (possibly due to low p2y12).  History includes CVA 06/20/15 (no residual deficits), fall (tripped) with scalp laceration s/p 6 staples 07/07/15 [facility]), smoking, DM2, hypothyroidism, anxiety, depression, HTN, dyslipidemia, right TKA, abdominal tumor excision, right elbow and shoulder surgeries. BMI is consistent with mild obesity.   PCP is Dr. Bea Graff at [place] [facility] in [place].  Meds include ASA 81 mg, Lipitor, Plavix, levothyroxine, losartan, metformin, Zoloft.  07/07/15 EKG [facility]): NSR.  06/20/15 Echo [facility]): Mild concentric LVH with normal LV systolic function, no segmental abnormality. Minimal aortic sclerosis with trace AR. Mitral annular calcium and leaflet thickening, mild MR, mild LA dilatation. Normal RV, mild TR, mild RAE, normal pulmonary artery pressure suggested. No PFO by Doppler. No intracardiac source for emboli identified.  06/20/15 Carotid U/S [facility]): Impression: Mild right and no appreciable left carotid plaque, with no hemodynamically significant ICA stenosis by Doppler criteria (0-49%). Normal antegrade flow in both vertebral arteries.   07/07/15 CXR [facility]): Impression: No acute findings, but evidence of a possible nodular opacity over there RLL that is not present on prior studies. Consider chest CT.   07/07/15 Chest CT w/o contrast [facility]): 1. No acute abnormality in the chest.  2. Multiple bilateral small pulmonary nodules measuring no more than 4 mm which remain unchanged compared to prior imaging from  August 2008 consistent with a benign process such as prior infectious/inflammatory or granulomatous disease.  3. Enlarged main and central pulmonary arteries as can be seen in the setting of pulmonary arterial hypertension.  4. Benign left adrenal adenoma.  5. High attenuation material layering within the incompletely imaged gallbladder suggests the presence of sludge and/or small stones.   07/07/15 Head CT w/o contrast [facility]): Infarct in the right basal ganglia region with evolution compared to recent prior studies. No more recent infarct identified. No hemorrhage or mass effect. No extra-axial flud collection. There are air-fluid levels in each sphenoid sinus. Question acute sinusitis verus hemorrhage. No fractures are identified in the visualized maxillofacial regions, and no intraorbital lesions are identified. Mastoids bilaterally are clear.   Preoperative labs noted. Cr 0.72, glucose 160. Alk Ph 181, AST 96, ALT 130 (Alk Ph 100, AST 16, ALT 33 on 07/07/15 at [facility]). CBC, PT/PTT WNL. p2y12 7. A1C 7.3 on 07/10/15. LFTs and p2y12 called to [facility] at Dr. Arlean Hopping office. She reported that even with p2y12 of 7, Dr. Estanislado Pandy was planning to proceed. However, with newly elevated LFTs anesthesiologist Dr. Oletta Lamas requested that her PCP review results and determine if he feels she can proceed as planned or will need additional evaluation prior to her procedure. If he does ultimately sign off on surgery plans then she will need HFP repeated on the day of her procedure. I spoke with Michela Pitcher, PA-C (pager [phone]) with IR. He will follow-up with Dr. Willette Pa office for input. I asked that I be given an update.  Patient with recent CVA felt due to tight right MCA stenosis with intervention recommended. Plans to proceed as scheduled will depend on PCP input.  George Hugh [facility] Short Stay Center/Anesthesiology Phone [phone] 08/29/2015 11:36 AM

## 2015-08-29 NOTE — Progress Notes (Signed)
   Pt was scheduled for Rt middle cerebral artery angioplasty/stent in IR with Dr Corliss Skains for 09/01/15  Noted pre op blood work with elevated LFTs New finding per anesthesia PA Loleta Books  Discussed with Dr Corliss Skains Pt to stop Plavix today We will postpone IR procedure 2/13  Pt to come in for conversation with Dr Corliss Skains 2/13 regarding medication --scheduler will call her with time of appointment  I have spoken to pt and she is agreeable to all instructions  I have also talked with Erie Noe RN with Dr Feliciana Rossetti Dr Corliss Skains recommends referral to GI MD for evaluation  RN aware and will inform MD

## 2015-09-01 ENCOUNTER — Ambulatory Visit (HOSPITAL_COMMUNITY): Admission: RE | Admit: 2015-09-01 | Payer: PPO | Source: Ambulatory Visit | Admitting: Interventional Radiology

## 2015-09-01 ENCOUNTER — Other Ambulatory Visit (HOSPITAL_COMMUNITY): Payer: Self-pay | Admitting: Interventional Radiology

## 2015-09-01 ENCOUNTER — Ambulatory Visit (HOSPITAL_COMMUNITY)
Admission: RE | Admit: 2015-09-01 | Discharge: 2015-09-01 | Disposition: A | Discharge status: Home / Self Care | Payer: PPO | Source: Ambulatory Visit | Attending: Interventional Radiology | Admitting: Interventional Radiology

## 2015-09-01 ENCOUNTER — Ambulatory Visit (HOSPITAL_COMMUNITY): Admission: RE | Admit: 2015-09-01 | Payer: Medicare HMO | Source: Ambulatory Visit

## 2015-09-01 ENCOUNTER — Encounter (HOSPITAL_COMMUNITY): Admission: RE | Payer: Self-pay | Source: Ambulatory Visit

## 2015-09-01 DIAGNOSIS — I671 Cerebral aneurysm, nonruptured: Secondary | ICD-10-CM

## 2015-09-01 SURGERY — RADIOLOGY WITH ANESTHESIA
Anesthesia: General

## 2015-09-02 ENCOUNTER — Telehealth (HOSPITAL_COMMUNITY): Payer: Self-pay | Admitting: Interventional Radiology

## 2015-09-02 NOTE — Telephone Encounter (Signed)
Spoke to pt on the phone on 09/01/15. She was concerned about her labs that were out of range and wanted to know if Deveshwar could see her. I had the pt come to the office at 3 pm. The pt and her son came in (had to wait an hour to see Dr. Corliss Skains due to a code stroke - but ok with the wait) and Dr. Corliss Skains spoke to them, at no charge and with no formal office visit, in great detail about her labs and why her intervention needed to be postponed. We gave her a copy of her labs to take with her on 09/02/15 to see her PCP and discuss further work-up. Dr. Corliss Skains asked the patient to make sure that her doctor (Dr. Feliciana Rossetti) contact him directly once this work-up was underway. She agreed to the plan of care and understood. JM

## 2015-09-04 ENCOUNTER — Telehealth (HOSPITAL_COMMUNITY): Payer: Self-pay | Admitting: Interventional Radiology

## 2015-09-04 NOTE — Telephone Encounter (Signed)
Ferol Luz with Dr. Anders Grant office. She stated that the patient had an [place] of her gallbladder and that the reason her liver enzymes are elevated is due to her gallstones. She is scheduled to see a general surgeon in [place] for gallbladder surgery. The patient will contact us when this surgery has been completed to rescheduled her procedure with Dr. Corliss Skains. JM

## 2015-09-30 ENCOUNTER — Telehealth (HOSPITAL_COMMUNITY): Payer: Self-pay | Admitting: Interventional Radiology

## 2015-09-30 ENCOUNTER — Other Ambulatory Visit: Payer: Self-pay | Admitting: Radiology

## 2015-09-30 NOTE — Telephone Encounter (Signed)
Called pt to discuss her upcoming procedure with Deveshwar scheduled for 10/06/15. She is going to start back on her Plavix 75mg  1/2 tablet again today. She will have a P2Y12 checked tomorrow (10/01/15). She will be advised of any medication changes after her labs are back tomorrow. Patient states understanding and is in agreement with this plan of care. Pt has continued on her Aspirin 81mg  1 daily. JM

## 2015-10-01 ENCOUNTER — Encounter (HOSPITAL_COMMUNITY): Payer: Self-pay

## 2015-10-01 ENCOUNTER — Encounter (HOSPITAL_COMMUNITY)
Admission: RE | Admit: 2015-10-01 | Discharge: 2015-10-01 | Disposition: A | Discharge status: Home / Self Care | Payer: PPO | Source: Ambulatory Visit | Attending: Interventional Radiology | Admitting: Interventional Radiology

## 2015-10-01 ENCOUNTER — Other Ambulatory Visit (HOSPITAL_COMMUNITY): Payer: PPO

## 2015-10-01 DIAGNOSIS — I6601 Occlusion and stenosis of right middle cerebral artery: Secondary | ICD-10-CM | POA: Insufficient documentation

## 2015-10-01 DIAGNOSIS — Z8673 Personal history of transient ischemic attack (TIA), and cerebral infarction without residual deficits: Secondary | ICD-10-CM | POA: Diagnosis not present

## 2015-10-01 DIAGNOSIS — E039 Hypothyroidism, unspecified: Secondary | ICD-10-CM | POA: Insufficient documentation

## 2015-10-01 DIAGNOSIS — Z01818 Encounter for other preprocedural examination: Secondary | ICD-10-CM | POA: Insufficient documentation

## 2015-10-01 DIAGNOSIS — E119 Type 2 diabetes mellitus without complications: Secondary | ICD-10-CM | POA: Insufficient documentation

## 2015-10-01 DIAGNOSIS — Z96651 Presence of right artificial knee joint: Secondary | ICD-10-CM | POA: Diagnosis not present

## 2015-10-01 DIAGNOSIS — I10 Essential (primary) hypertension: Secondary | ICD-10-CM | POA: Insufficient documentation

## 2015-10-01 DIAGNOSIS — F419 Anxiety disorder, unspecified: Secondary | ICD-10-CM | POA: Diagnosis not present

## 2015-10-01 DIAGNOSIS — Z7902 Long term (current) use of antithrombotics/antiplatelets: Secondary | ICD-10-CM | POA: Diagnosis not present

## 2015-10-01 DIAGNOSIS — Z79899 Other long term (current) drug therapy: Secondary | ICD-10-CM | POA: Insufficient documentation

## 2015-10-01 DIAGNOSIS — E785 Hyperlipidemia, unspecified: Secondary | ICD-10-CM | POA: Insufficient documentation

## 2015-10-01 DIAGNOSIS — Z7982 Long term (current) use of aspirin: Secondary | ICD-10-CM | POA: Insufficient documentation

## 2015-10-01 DIAGNOSIS — F329 Major depressive disorder, single episode, unspecified: Secondary | ICD-10-CM | POA: Diagnosis not present

## 2015-10-01 DIAGNOSIS — Z01812 Encounter for preprocedural laboratory examination: Secondary | ICD-10-CM | POA: Insufficient documentation

## 2015-10-01 LAB — COMPREHENSIVE METABOLIC PANEL
ALT: 27 U/L (ref 14–54)
ANION GAP: 10 (ref 5–15)
AST: 19 U/L (ref 15–41)
Albumin: 3.4 g/dL — ABNORMAL LOW (ref 3.5–5.0)
Alkaline Phosphatase: 96 U/L (ref 38–126)
BUN: 13 mg/dL (ref 6–20)
CHLORIDE: 103 mmol/L (ref 101–111)
CO2: 27 mmol/L (ref 22–32)
Calcium: 9 mg/dL (ref 8.9–10.3)
Creatinine, Ser: 0.7 mg/dL (ref 0.44–1.00)
Glucose, Bld: 260 mg/dL — ABNORMAL HIGH (ref 65–99)
Potassium: 3.8 mmol/L (ref 3.5–5.1)
SODIUM: 140 mmol/L (ref 135–145)
Total Bilirubin: 0.4 mg/dL (ref 0.3–1.2)
Total Protein: 6.3 g/dL — ABNORMAL LOW (ref 6.5–8.1)

## 2015-10-01 LAB — APTT: aPTT: 28 seconds (ref 24–37)

## 2015-10-01 LAB — CBC WITH DIFFERENTIAL/PLATELET
Basophils Absolute: 0 10*3/uL (ref 0.0–0.1)
Basophils Relative: 0 %
EOS ABS: 0.3 10*3/uL (ref 0.0–0.7)
EOS PCT: 4 %
HCT: 39.5 % (ref 36.0–46.0)
Hemoglobin: 12.5 g/dL (ref 12.0–15.0)
LYMPHS ABS: 2.1 10*3/uL (ref 0.7–4.0)
LYMPHS PCT: 28 %
MCH: 29.4 pg (ref 26.0–34.0)
MCHC: 31.6 g/dL (ref 30.0–36.0)
MCV: 92.9 fL (ref 78.0–100.0)
MONO ABS: 0.4 10*3/uL (ref 0.1–1.0)
MONOS PCT: 5 %
Neutro Abs: 4.7 10*3/uL (ref 1.7–7.7)
Neutrophils Relative %: 63 %
PLATELETS: 181 10*3/uL (ref 150–400)
RBC: 4.25 MIL/uL (ref 3.87–5.11)
RDW: 14.3 % (ref 11.5–15.5)
WBC: 7.6 10*3/uL (ref 4.0–10.5)

## 2015-10-01 LAB — PROTIME-INR
INR: 0.93 (ref 0.00–1.49)
PROTHROMBIN TIME: 12.7 s (ref 11.6–15.2)

## 2015-10-01 LAB — GLUCOSE, CAPILLARY: Glucose-Capillary: 212 mg/dL — ABNORMAL HIGH (ref 65–99)

## 2015-10-01 NOTE — Progress Notes (Addendum)
Pt states that had stroke December 2016 with no residual weakness (although walks with a cane due to past knee surgery). Stenting procedure canceled the end of December 2016 due to "blood being too thin" and then again in February 2017 because of elevated liver enzymes.  Pt saw Dr Bettey Mareavid Gimenez 09/04/15 for evaluation. Pt states enzymes are elevated due to galbladder issues, but cannot get galbladder out until stenting is done.  Denies having cardiologist.  States had Echocardiogram in December 2016 ordered by Dr Alona BeneJoyce. Will request records.  Denies Stress test or heart cath.  PCP Dr Alona BeneJoyce (at Dr Anders GrantGrisso's office) at [facility] in [place].  She was last seen Thursday 09/25/15 and they are aware of upcoming procedure  States had EKG and Chest Xray done at [facility] in [place] in December when had her stroke.  Will request these records.   States had sleep study >5 yrs ago but was told was OK and does not need Cpap

## 2015-10-01 NOTE — Pre-Procedure Instructions (Signed)
Maria Mueller  10/01/2015      [facility] [name]- [place], [place] - [address] [facility] [address] [place] [place] [address] Phone: [phone] Fax: [phone]    Your procedure is scheduled on Monday March 20th at 8:30 am  Report to [facility] Admitting at 6:30 am  Call this number if you have problems the morning of surgery:  [phone]   Remember:  Do not eat food or drink liquids after midnight.  Take these medicines the morning of surgery with A SIP OF WATER: Aspirin, Plavix, Levothyroxine (Synthroid), Zoloft Stop taking all Nsaids (i.e. Ibuprofen, Aleve, Motrin, Advil, including Meloxicam), vitamins and herbal supplements  Do not take oral diabetes medicines (pills) the morning of surgery.  How to Manage Your Diabetes Before Surgery   Why is it important to control my blood sugar before and after surgery?   Improving blood sugar levels before and after surgery helps healing and can limit problems.  A way of improving blood sugar control is eating a healthy diet by:  - Eating less sugar and carbohydrates  - Increasing activity/exercise  - Talk with your doctor about reaching your blood sugar goals  High blood sugars (greater than 180 mg/dL) can raise your risk of infections and slow down your recovery so you will need to focus on controlling your diabetes during the weeks before surgery.  Make sure that the doctor who takes care of your diabetes knows about your planned surgery including the date and location.  How do I manage my blood sugars before surgery?   Check your blood sugar at least 4 times a day, 2 days before surgery to make sure that they are not too high or low.   Check your blood sugar the morning of your surgery when you wake up and every 2               hours until you get to the Short-Stay unit.  If your blood sugar is less than 70 mg/dL, you will need to treat for low  blood sugar by:  Treat a low blood sugar (less than 70 mg/dL) with 1/2 cup of clear juice (cranberry or apple), 4 glucose tablets, OR glucose gel.  Recheck blood sugar in 15 minutes after treatment (to make sure it is greater than 70 mg/dL).  If blood sugar is not greater than 70 mg/dL on re-check, call [phone] for further instructions.   Report your blood sugar to the Short-Stay nurse when you get to Short-Stay.  References:  [facility], 2007 "How to Manage your Diabetes Before and After Surgery"    Do not wear jewelry, make-up or nail polish.  Do not wear lotions, powders, or perfumes.  You may not wear deodorant.  Do not shave 48 hours prior to surgery.  Men may shave face and neck.  Do not bring valuables to the hospital.  [facility] is not responsible for any belongings or valuables.  Contacts, dentures or bridgework may not be worn into surgery.  Leave your suitcase in the car.  After surgery it may be brought to your room.  For patients admitted to the hospital, discharge time will be determined by your treatment team.  Patients discharged the day of surgery will not be allowed to drive home.    Please read over the following fact sheets that you were given. Pain Booklet, Coughing and Deep  Breathing and Surgical Site Infection Prevention

## 2015-10-02 LAB — HEMOGLOBIN A1C
HEMOGLOBIN A1C: 7.9 % — AB (ref 4.8–5.6)
Mean Plasma Glucose: 180 mg/dL

## 2015-10-02 NOTE — Progress Notes (Signed)
Anesthesia Chart Review: Patient is a 70 year old female scheduled for angioplasty with stenting on 09/01/15 by Dr. Estanislado Pandy. She has a history of CVA 06/20/15 [facility]) with MRA revealing a tight focal stenosis of the right MCA proximally which probably caused her CVA so intervention was recommended. Procedure was initially scheduled for 07/16/15, but was rescheduled for unclear reasons (possibly due to low p2y12). It was postponed again on 09/01/15 due to elevated LFTs, and she was re-evaluated by her PCP. Reportedly was told it was related to gallbladder disease that could be further addressed following this procedure.  History includes CVA 06/20/15 (no residual deficits), fall (tripped) with scalp laceration s/p 6 staples 07/07/15 [facility]), smoking, DM2, hypothyroidism, anxiety, depression, HTN, dyslipidemia, right TKA, abdominal tumor excision, right elbow and shoulder surgeries. BMI is consistent with mild obesity.   PCP is Dr. Bea Graff at [facility] in [place].  Meds include ASA 81 mg, Lipitor, Plavix, levothyroxine, losartan, metformin, Zoloft, oxybutynin.  07/07/15 EKG [facility]): NSR.  06/20/15 Echo [facility]): Mild concentric LVH with normal LV systolic function, no segmental abnormality. Minimal aortic sclerosis with trace AR. Mitral annular calcium and leaflet thickening, mild MR, mild LA dilatation. Normal RV, mild TR, mild RAE, normal pulmonary artery pressure suggested. No PFO by Doppler. No intracardiac source for emboli identified.  06/20/15 Carotid U/S [facility]): Impression: Mild right and no appreciable left carotid plaque, with no hemodynamically significant ICA stenosis by Doppler criteria (0-49%). Normal antegrade flow in both vertebral arteries.   07/07/15 CXR [facility]): Impression: No acute findings, but evidence of a possible nodular opacity over there RLL that is not present on prior studies. Consider chest CT.    07/07/15 Chest CT w/o contrast [facility]): 1. No acute abnormality in the chest.  2. Multiple bilateral small pulmonary nodules measuring no more than 4 mm which remain unchanged compared to prior imaging from August 2008 consistent with a benign process such as prior infectious/inflammatory or granulomatous disease.  3. Enlarged main and central pulmonary arteries as can be seen in the setting of pulmonary arterial hypertension.  4. Benign left adrenal adenoma.  5. High attenuation material layering within the incompletely imaged gallbladder suggests the presence of sludge and/or small stones.   07/07/15 Head CT w/o contrast [facility]): Infarct in the right basal ganglia region with evolution compared to recent prior studies. No more recent infarct identified. No hemorrhage or mass effect. No extra-axial flud collection. There are air-fluid levels in each sphenoid sinus. Question acute sinusitis verus hemorrhage. No fractures are identified in the visualized maxillofacial regions, and no intraorbital lesions are identified. Mastoids bilaterally are clear.   Preoperative labs noted. Cr 0.70, glucose 260. Alk Ph 96, AST 19, ALT 27. CBC, PT/PTT WNL. A1c 7.9. She is for p2y12 on the day of surgery.  Patient with recent CVA felt due to tight right MCA stenosis with intervention recommended. Her LFTs have normalized. If no acute changes then I anticipate that she can proceed as planned from an anesthesia standpoint.   George Hugh [facility] Short Stay Center/Anesthesiology Phone [phone] 10/02/2015 1:48 PM

## 2015-10-03 ENCOUNTER — Other Ambulatory Visit: Payer: Self-pay | Admitting: General Surgery

## 2015-10-05 MED ORDER — NIMODIPINE 30 MG PO CAPS: 0.0000 mg | ORAL_CAPSULE | ORAL | Status: DC

## 2015-10-05 MED ORDER — CLOPIDOGREL BISULFATE 75 MG PO TABS: 75.0000 mg | ORAL_TABLET | ORAL | Status: DC

## 2015-10-05 MED ORDER — ASPIRIN EC 325 MG PO TBEC: 325.0000 mg | DELAYED_RELEASE_TABLET | ORAL | Status: DC

## 2015-10-06 ENCOUNTER — Encounter (HOSPITAL_COMMUNITY): Payer: Self-pay | Admitting: Surgery

## 2015-10-06 ENCOUNTER — Ambulatory Visit (HOSPITAL_COMMUNITY): Payer: PPO | Admitting: Certified Registered"

## 2015-10-06 ENCOUNTER — Ambulatory Visit (HOSPITAL_COMMUNITY)
Admission: RE | Admit: 2015-10-06 | Discharge: 2015-10-06 | Disposition: A | Discharge status: Home / Self Care | Payer: PPO | Source: Ambulatory Visit | Attending: Interventional Radiology | Admitting: Interventional Radiology

## 2015-10-06 ENCOUNTER — Encounter (HOSPITAL_COMMUNITY)
Admission: RE | Disposition: A | Discharge status: Home / Self Care | Payer: Self-pay | Source: Ambulatory Visit | Attending: Interventional Radiology

## 2015-10-06 ENCOUNTER — Inpatient Hospital Stay (HOSPITAL_COMMUNITY)
Admission: RE | Admit: 2015-10-06 | Discharge: 2015-10-07 | DRG: 027 | Disposition: A | Discharge status: Home / Self Care | Payer: PPO | Source: Ambulatory Visit | Attending: Interventional Radiology | Admitting: Interventional Radiology

## 2015-10-06 ENCOUNTER — Encounter (HOSPITAL_COMMUNITY): Payer: Self-pay

## 2015-10-06 ENCOUNTER — Ambulatory Visit (HOSPITAL_COMMUNITY): Payer: PPO | Admitting: Emergency Medicine

## 2015-10-06 DIAGNOSIS — Z7902 Long term (current) use of antithrombotics/antiplatelets: Secondary | ICD-10-CM

## 2015-10-06 DIAGNOSIS — I771 Stricture of artery: Secondary | ICD-10-CM

## 2015-10-06 DIAGNOSIS — E785 Hyperlipidemia, unspecified: Secondary | ICD-10-CM | POA: Diagnosis present

## 2015-10-06 DIAGNOSIS — F329 Major depressive disorder, single episode, unspecified: Secondary | ICD-10-CM | POA: Diagnosis present

## 2015-10-06 DIAGNOSIS — Z8673 Personal history of transient ischemic attack (TIA), and cerebral infarction without residual deficits: Secondary | ICD-10-CM

## 2015-10-06 DIAGNOSIS — Z7982 Long term (current) use of aspirin: Secondary | ICD-10-CM

## 2015-10-06 DIAGNOSIS — I6609 Occlusion and stenosis of unspecified middle cerebral artery: Secondary | ICD-10-CM | POA: Diagnosis present

## 2015-10-06 DIAGNOSIS — F17210 Nicotine dependence, cigarettes, uncomplicated: Secondary | ICD-10-CM | POA: Diagnosis present

## 2015-10-06 DIAGNOSIS — M1990 Unspecified osteoarthritis, unspecified site: Secondary | ICD-10-CM | POA: Diagnosis present

## 2015-10-06 DIAGNOSIS — Z791 Long term (current) use of non-steroidal anti-inflammatories (NSAID): Secondary | ICD-10-CM

## 2015-10-06 DIAGNOSIS — F419 Anxiety disorder, unspecified: Secondary | ICD-10-CM | POA: Diagnosis present

## 2015-10-06 DIAGNOSIS — E039 Hypothyroidism, unspecified: Secondary | ICD-10-CM | POA: Diagnosis present

## 2015-10-06 DIAGNOSIS — I10 Essential (primary) hypertension: Secondary | ICD-10-CM | POA: Diagnosis present

## 2015-10-06 DIAGNOSIS — Z96651 Presence of right artificial knee joint: Secondary | ICD-10-CM | POA: Diagnosis present

## 2015-10-06 DIAGNOSIS — I6389 Other cerebral infarction: Secondary | ICD-10-CM

## 2015-10-06 DIAGNOSIS — I6601 Occlusion and stenosis of right middle cerebral artery: Principal | ICD-10-CM | POA: Diagnosis present

## 2015-10-06 DIAGNOSIS — E119 Type 2 diabetes mellitus without complications: Secondary | ICD-10-CM | POA: Diagnosis present

## 2015-10-06 DIAGNOSIS — Z7984 Long term (current) use of oral hypoglycemic drugs: Secondary | ICD-10-CM | POA: Diagnosis not present

## 2015-10-06 HISTORY — PX: RADIOLOGY WITH ANESTHESIA: SHX6223

## 2015-10-06 LAB — POCT ACTIVATED CLOTTING TIME
Activated Clotting Time: 167 seconds
Activated Clotting Time: 168 seconds

## 2015-10-06 LAB — PLATELET INHIBITION P2Y12: Platelet Function  P2Y12: 68 [PRU] — ABNORMAL LOW (ref 194–418)

## 2015-10-06 LAB — HEPARIN LEVEL (UNFRACTIONATED)

## 2015-10-06 LAB — GLUCOSE, CAPILLARY
GLUCOSE-CAPILLARY: 141 mg/dL — AB (ref 65–99)
GLUCOSE-CAPILLARY: 220 mg/dL — AB (ref 65–99)
GLUCOSE-CAPILLARY: 96 mg/dL (ref 65–99)
Glucose-Capillary: 116 mg/dL — ABNORMAL HIGH (ref 65–99)

## 2015-10-06 LAB — MRSA PCR SCREENING: MRSA by PCR: NEGATIVE

## 2015-10-06 SURGERY — RADIOLOGY WITH ANESTHESIA
Anesthesia: Monitor Anesthesia Care

## 2015-10-06 MED ORDER — PHENYLEPHRINE HCL 10 MG/ML IJ SOLN
INTRAMUSCULAR | Status: DC | PRN
  Administered 2015-10-06: 40 ug via INTRAVENOUS

## 2015-10-06 MED ORDER — GLYCOPYRROLATE 0.2 MG/ML IJ SOLN
INTRAMUSCULAR | Status: DC | PRN
  Administered 2015-10-06: 0.4 mg via INTRAVENOUS

## 2015-10-06 MED ORDER — CEFAZOLIN SODIUM-DEXTROSE 2-3 GM-% IV SOLR: 2.0000 g | INTRAVENOUS | Status: DC

## 2015-10-06 MED ORDER — PROPOFOL 10 MG/ML IV BOLUS
INTRAVENOUS | Status: DC | PRN
  Administered 2015-10-06: 80 mg via INTRAVENOUS
  Administered 2015-10-06: 20 mg via INTRAVENOUS
  Administered 2015-10-06: 100 mg via INTRAVENOUS

## 2015-10-06 MED ORDER — MIDAZOLAM HCL 2 MG/2ML IJ SOLN
INTRAMUSCULAR | Status: DC | PRN
  Administered 2015-10-06 (×2): 1 mg via INTRAVENOUS

## 2015-10-06 MED ORDER — PHENYLEPHRINE HCL 10 MG/ML IJ SOLN
10.0000 mg | INTRAVENOUS | Status: DC | PRN
  Administered 2015-10-06: 10 ug/min via INTRAVENOUS

## 2015-10-06 MED ORDER — NITROGLYCERIN 0.2 MG/ML ON CALL CATH LAB
INTRAVENOUS | Status: DC | PRN
  Administered 2015-10-06: 80 ug via INTRAVENOUS
  Administered 2015-10-06 (×3): 160 ug via INTRAVENOUS

## 2015-10-06 MED ORDER — SERTRALINE HCL 50 MG PO TABS
100.0000 mg | ORAL_TABLET | Freq: Two times a day (BID) | ORAL | Status: DC
  Administered 2015-10-06 – 2015-10-07 (×2): 100 mg via ORAL
  Filled 2015-10-06 (×2): qty 2

## 2015-10-06 MED ORDER — NITROGLYCERIN 1 MG/10 ML FOR IR/CATH LAB
INTRA_ARTERIAL | Status: AC
  Filled 2015-10-06: qty 10

## 2015-10-06 MED ORDER — ASPIRIN EC 325 MG PO TBEC: 325.0000 mg | DELAYED_RELEASE_TABLET | ORAL | Status: DC

## 2015-10-06 MED ORDER — ONDANSETRON HCL 4 MG/2ML IJ SOLN: 4.0000 mg | Freq: Four times a day (QID) | INTRAMUSCULAR | Status: DC | PRN

## 2015-10-06 MED ORDER — LIDOCAINE HCL (CARDIAC) 20 MG/ML IV SOLN
INTRAVENOUS | Status: DC | PRN
  Administered 2015-10-06: 60 mg via INTRAVENOUS

## 2015-10-06 MED ORDER — ACETAMINOPHEN 500 MG PO TABS
1000.0000 mg | ORAL_TABLET | Freq: Four times a day (QID) | ORAL | Status: DC | PRN
  Administered 2015-10-06 – 2015-10-07 (×3): 1000 mg via ORAL
  Filled 2015-10-06 (×3): qty 2

## 2015-10-06 MED ORDER — ACETAMINOPHEN 650 MG RE SUPP: 650.0000 mg | Freq: Four times a day (QID) | RECTAL | Status: DC | PRN

## 2015-10-06 MED ORDER — CLOPIDOGREL BISULFATE 75 MG PO TABS
75.0000 mg | ORAL_TABLET | Freq: Every day | ORAL | Status: DC
  Administered 2015-10-07: 75 mg via ORAL
  Filled 2015-10-06: qty 1

## 2015-10-06 MED ORDER — IOHEXOL 300 MG/ML  SOLN
300.0000 mL | Freq: Once | INTRAMUSCULAR | Status: AC | PRN
Start: 2015-10-06 — End: 2015-10-06
  Administered 2015-10-06: 150 mL via INTRAVENOUS

## 2015-10-06 MED ORDER — ACETAMINOPHEN 325 MG PO TABS: 325.0000 mg | ORAL_TABLET | ORAL | Status: DC | PRN

## 2015-10-06 MED ORDER — HEPARIN (PORCINE) IN NACL 100-0.45 UNIT/ML-% IJ SOLN
600.0000 [IU]/h | INTRAMUSCULAR | Status: DC
  Administered 2015-10-06: 500 [IU]/h via INTRAVENOUS
  Filled 2015-10-06: qty 250

## 2015-10-06 MED ORDER — LACTATED RINGERS IV SOLN
INTRAVENOUS | Status: DC
  Administered 2015-10-06 (×2): via INTRAVENOUS

## 2015-10-06 MED ORDER — CLOPIDOGREL BISULFATE 75 MG PO TABS: 75.0000 mg | ORAL_TABLET | ORAL | Status: DC

## 2015-10-06 MED ORDER — OXYCODONE HCL 5 MG PO TABS: 5.0000 mg | ORAL_TABLET | Freq: Once | ORAL | Status: DC | PRN

## 2015-10-06 MED ORDER — OXYCODONE HCL 5 MG/5ML PO SOLN: 5.0000 mg | Freq: Once | ORAL | Status: DC | PRN

## 2015-10-06 MED ORDER — ONDANSETRON HCL 4 MG/2ML IJ SOLN
INTRAMUSCULAR | Status: DC | PRN
  Administered 2015-10-06: 4 mg via INTRAVENOUS

## 2015-10-06 MED ORDER — LOSARTAN POTASSIUM 50 MG PO TABS
50.0000 mg | ORAL_TABLET | Freq: Every day | ORAL | Status: DC
  Administered 2015-10-07: 50 mg via ORAL
  Filled 2015-10-06: qty 1

## 2015-10-06 MED ORDER — NIMODIPINE 30 MG PO CAPS
0.0000 mg | ORAL_CAPSULE | ORAL | Status: AC
  Administered 2015-10-06: 60 mg via ORAL
  Filled 2015-10-06: qty 2

## 2015-10-06 MED ORDER — OXYBUTYNIN CHLORIDE 5 MG PO TABS
5.0000 mg | ORAL_TABLET | Freq: Two times a day (BID) | ORAL | Status: DC
  Administered 2015-10-06 – 2015-10-07 (×2): 5 mg via ORAL
  Filled 2015-10-06 (×3): qty 1

## 2015-10-06 MED ORDER — FENTANYL CITRATE (PF) 100 MCG/2ML IJ SOLN: 25.0000 ug | INTRAMUSCULAR | Status: DC | PRN

## 2015-10-06 MED ORDER — LABETALOL HCL 5 MG/ML IV SOLN
INTRAVENOUS | Status: DC | PRN
  Administered 2015-10-06: 15 mg via INTRAVENOUS

## 2015-10-06 MED ORDER — INSULIN ASPART 100 UNIT/ML ~~LOC~~ SOLN
0.0000 [IU] | Freq: Three times a day (TID) | SUBCUTANEOUS | Status: DC
  Administered 2015-10-06: 3 [IU] via SUBCUTANEOUS

## 2015-10-06 MED ORDER — LEVOTHYROXINE SODIUM 75 MCG PO TABS
75.0000 ug | ORAL_TABLET | Freq: Every day | ORAL | Status: DC
  Administered 2015-10-07: 75 ug via ORAL
  Filled 2015-10-06: qty 1

## 2015-10-06 MED ORDER — HEPARIN (PORCINE) IN NACL 100-0.45 UNIT/ML-% IJ SOLN
INTRAMUSCULAR | Status: AC
  Administered 2015-10-06: 500 [IU]/h via INTRAVENOUS
  Filled 2015-10-06: qty 250

## 2015-10-06 MED ORDER — IBUPROFEN 200 MG PO TABS
400.0000 mg | ORAL_TABLET | Freq: Two times a day (BID) | ORAL | Status: DC | PRN
Start: 2015-10-06 — End: 2015-10-07

## 2015-10-06 MED ORDER — LIDOCAINE HCL 1 % IJ SOLN
INTRAMUSCULAR | Status: AC
  Filled 2015-10-06: qty 20

## 2015-10-06 MED ORDER — SODIUM CHLORIDE 0.9 % IV SOLN: Freq: Once | INTRAVENOUS | Status: DC

## 2015-10-06 MED ORDER — NEOSTIGMINE METHYLSULFATE 10 MG/10ML IV SOLN
INTRAVENOUS | Status: DC | PRN
  Administered 2015-10-06: 3 mg via INTRAVENOUS

## 2015-10-06 MED ORDER — HEPARIN SOD (PORK) LOCK FLUSH 100 UNIT/ML IV SOLN
INTRAVENOUS | Status: AC
  Filled 2015-10-06: qty 5

## 2015-10-06 MED ORDER — HEPARIN SODIUM (PORCINE) 1000 UNIT/ML IJ SOLN
INTRAMUSCULAR | Status: DC | PRN
  Administered 2015-10-06 (×4): 500 [IU] via INTRAVENOUS
  Administered 2015-10-06: 3000 [IU] via INTRAVENOUS

## 2015-10-06 MED ORDER — MELOXICAM 7.5 MG PO TABS
15.0000 mg | ORAL_TABLET | Freq: Every day | ORAL | Status: DC
  Administered 2015-10-07: 15 mg via ORAL
  Filled 2015-10-06: qty 1

## 2015-10-06 MED ORDER — ASPIRIN 325 MG PO TABS
325.0000 mg | ORAL_TABLET | Freq: Every day | ORAL | Status: DC
  Administered 2015-10-07: 325 mg via ORAL
  Filled 2015-10-06: qty 1

## 2015-10-06 MED ORDER — SODIUM CHLORIDE 0.9 % IV SOLN
INTRAVENOUS | Status: DC
  Administered 2015-10-07: 1000 mL via INTRAVENOUS

## 2015-10-06 MED ORDER — FENTANYL CITRATE (PF) 250 MCG/5ML IJ SOLN
INTRAMUSCULAR | Status: DC | PRN
  Administered 2015-10-06: 100 ug via INTRAVENOUS
  Administered 2015-10-06 (×2): 50 ug via INTRAVENOUS

## 2015-10-06 MED ORDER — ACETAMINOPHEN 160 MG/5ML PO SOLN: 325.0000 mg | ORAL | Status: DC | PRN

## 2015-10-06 MED ORDER — SODIUM CHLORIDE 0.9 % IJ SOLN
25.0000 ug | INTRAVENOUS | Status: AC | PRN
  Administered 2015-10-06: 25 ug via INTRA_ARTERIAL

## 2015-10-06 MED ORDER — HEPARIN (PORCINE) IN NACL 100-0.45 UNIT/ML-% IJ SOLN
700.0000 [IU]/h | INTRAMUSCULAR | Status: AC
  Filled 2015-10-06: qty 250

## 2015-10-06 MED ORDER — ROCURONIUM BROMIDE 100 MG/10ML IV SOLN
INTRAVENOUS | Status: DC | PRN
  Administered 2015-10-06: 50 mg via INTRAVENOUS

## 2015-10-06 MED ORDER — NICARDIPINE HCL IN NACL 20-0.86 MG/200ML-% IV SOLN
5.0000 mg/h | INTRAVENOUS | Status: DC
  Administered 2015-10-06: 5 mg/h via INTRAVENOUS
  Filled 2015-10-06: qty 200

## 2015-10-06 MED ORDER — EPHEDRINE SULFATE 50 MG/ML IJ SOLN
INTRAMUSCULAR | Status: DC | PRN
  Administered 2015-10-06: 5 mg via INTRAVENOUS
  Administered 2015-10-06: 2.5 mg via INTRAVENOUS
  Administered 2015-10-06 (×2): 5 mg via INTRAVENOUS
  Administered 2015-10-06: 2.5 mg via INTRAVENOUS

## 2015-10-06 MED ORDER — CEFAZOLIN SODIUM-DEXTROSE 2-3 GM-% IV SOLR
2.0000 g | INTRAVENOUS | Status: DC
  Filled 2015-10-06: qty 50

## 2015-10-06 NOTE — H&P (Signed)
Chief Complaint: Patient was seen in consultation today for cerebral arteriogram with possible angioplasty/stent Right middle cerebral artery at the request of Dr Feliciana RossettiGreg Grisso  Referring Physician(s): Dr Feliciana RossettiGreg Grisso  Supervising Physician: Dr Julieanne CottonSanjeev Deveshwar  History of Present Illness: Maria Mueller is a 70 y.o. female   CVA 06/2015 Noted Right middle cerebral artery stenosis  [facility]2015 Referred to Dr Corliss Skainseveshwar Cerebral arteriogram and possible angioplasty/stent placement scheduled today Pt taking Plavix/ ASA 81 mg daily P2y12 68 today  Past Medical History  Diagnosis Date  . Hypertension   . Dyslipidemia     takes Atorvastatin daily  . History of pneumonia   . History of bronchitis   . Anxiety   . Urinary frequency   . Urinary incontinence   . Arthritis   . Diabetes mellitus without complication (HCC)     takes Metformin daily  . Depression     takes Zoloft daily  . Hypothyroidism     takes Synthroid daily  . Stroke (HCC)     06/20/15, no deficits    Past Surgical History  Procedure Laterality Date  . Bilateral carpal tunnel release    . Elbow surgery Right   . Shoulder surgery Right   . Knee arthroplasty Right   . Tumor excision      abdominal  . Abdominal hysterectomy    . Radiology with anesthesia N/A 07/16/2015    Procedure: Angioplasty with Stenting;  Surgeon: Julieanne CottonSanjeev Deveshwar, MD;  [facility];  Service: Radiology;  Laterality: N/A;  . Trigger finger release Right many years ago     thumb    Allergies: Review of patient's allergies indicates no known allergies.  Medications: Prior to Admission medications   Medication Sig Start Date End Date Taking? Authorizing Provider  aspirin 81 MG tablet Take 81 mg by mouth daily.    Historical Provider, MD  atorvastatin (LIPITOR) 40 MG tablet Take 40 mg by mouth 2 (two) times daily.    Historical Provider, MD  clopidogrel (PLAVIX) 75 MG tablet Take 37.5  mg by mouth daily.     Historical Provider, MD  ibuprofen (ADVIL,MOTRIN) 200 MG tablet Take 400 mg by mouth 2 (two) times daily as needed for headache.    Historical Provider, MD  levothyroxine (SYNTHROID, LEVOTHROID) 75 MCG tablet Take 75 mcg by mouth daily before breakfast.    Historical Provider, MD  losartan (COZAAR) 50 MG tablet Take 50 mg by mouth daily.    Historical Provider, MD  meloxicam (MOBIC) 15 MG tablet Take 15 mg by mouth daily.    Historical Provider, MD  metFORMIN (GLUMETZA) 500 MG (MOD) 24 hr tablet Take 1,000 mg by mouth at bedtime.     Historical Provider, MD  oxybutynin (DITROPAN) 5 MG tablet Take 5 mg by mouth 2 (two) times daily. 09/25/15   Historical Provider, MD  sertraline (ZOLOFT) 100 MG tablet Take 100 mg by mouth 2 (two) times daily.    Historical Provider, MD     History reviewed. No pertinent family history.  Social History   Social History  . Marital Status: Married    Spouse Name: N/A  . Number of Children: N/A  . Years of Education: N/A   Social History Main Topics  . Smoking status: Current Every Day Smoker -- 0.50 packs/day for 49 years    Types: Cigarettes  . Smokeless tobacco: None     Comment: smoking cessation info given  .  Alcohol Use: No  . Drug Use: No  . Sexual Activity: Not Asked   Other Topics Concern  . None   Social History Narrative     Review of Systems: A 12 point ROS discussed and pertinent positives are indicated in the HPI above.  All other systems are negative.  Review of Systems  Constitutional: Negative for fever, activity change and appetite change.  HENT: Negative for hearing loss, tinnitus and trouble swallowing.   Eyes: Negative for visual disturbance.  Respiratory: Negative for shortness of breath.   Gastrointestinal: Negative for abdominal pain.  Musculoskeletal: Positive for gait problem.        Rt knee surgery---uses cane  Neurological: Negative for dizziness, tremors, seizures, syncope, facial asymmetry,  speech difficulty, weakness, light-headedness, numbness and headaches.  Psychiatric/Behavioral: Negative for behavioral problems and confusion.    Vital Signs: There were no vitals taken for this visit.  Physical Exam  Constitutional: She is oriented to person, place, and time. She appears well-nourished.  HENT:  Head: Atraumatic.  Eyes: EOM are normal.  Neck: Normal range of motion.  Cardiovascular: Normal rate, regular rhythm and normal heart sounds.   No murmur heard. Pulmonary/Chest: Effort normal and breath sounds normal. She has no wheezes.  Abdominal: Soft. Bowel sounds are normal. There is no tenderness.  Musculoskeletal: Normal range of motion.  Neurological: She is alert and oriented to person, place, and time.  Skin: Skin is warm and dry.  Psychiatric: She has a normal mood and affect. Her behavior is normal. Judgment and thought content normal.  Nursing note and vitals reviewed.   Mallampati Score:  MD Evaluation Airway: WNL Heart: WNL Abdomen: WNL Chest/ Lungs: WNL ASA  Classification: 3 Mallampati/Airway Score: One  Imaging: No results found.  Labs:  CBC:  Recent Labs  07/10/15 1351 08/28/15 0936 10/01/15 1045  WBC 15.1* 7.7 7.6  HGB 12.9 12.4 12.5  HCT 40.2 38.7 39.5  PLT 331 220 181    COAGS:  Recent Labs  07/10/15 1351 08/28/15 0936 10/01/15 1045  INR 1.01 0.98 0.93  APTT BMP:  Recent Labs  07/10/15 1351 08/28/15 0936 10/01/15 1045  NA 139 140 140  K 3.9 3.6 3.8  CL 108 105 103  CO2 GLUCOSE 149* 160* 260*  BUN CALCIUM 9.4 8.7* 9.0  CREATININE 0.78 0.72 0.70  GFRNONAA >60 >60 >60  GFRAA >60 >60 >60    LIVER FUNCTION TESTS:  Recent Labs  08/28/15 0936 10/01/15 1045  BILITOT 0.7 0.4  AST 96* 19  ALT 130* 27  ALKPHOS 181* 96  PROT 6.5 6.3*  ALBUMIN 3.4* 3.4*    TUMOR MARKERS: No results for input(s): AFPTM, CEA, CA199, CHROMGRNA in the last 8760 hours.  Assessment and  Plan:  CVA 06/2015 R MCA stenosis Now scheduled for cerebral arteriogram with possible angioplasty/stent placement Risks and Benefits discussed with the patient including, but not limited to bleeding, infection, vascular injury, contrast induced renal failure, stroke or even death. All of the patient's questions were answered, patient is agreeable to proceed. Consent signed and in chart.  Pt and family aware she will be admitted overnight in Neuro ICU of intervention performed All agreeable to proceed  Thank you for this interesting consult.  I greatly enjoyed meeting Maria Mueller and look forward to participating in their care.  A copy of this report was sent to the requesting provider on this date.  Electronically  Signed: Lerin Jech A 10/06/2015, 8:24 AM   I spent a total of  30 Minutes   in face to face in clinical consultation, greater than 50% of which was counseling/coordinating care for cerebral arteriogram with possible angioplasty/stent

## 2015-10-06 NOTE — Anesthesia Procedure Notes (Signed)
Procedure Name: Intubation Date/Time: 10/06/2015 10:14 AM Performed by: Lucinda DellECARLO, Markayla Reichart M Pre-anesthesia Checklist: Patient identified, Emergency Drugs available, Suction available and Patient being monitored Patient Re-evaluated:Patient Re-evaluated prior to inductionOxygen Delivery Method: Circle system utilized Preoxygenation: Pre-oxygenation with 100% oxygen Intubation Type: IV induction Ventilation: Mask ventilation without difficulty Laryngoscope Size: Mac and 3 Grade View: Grade II Tube type: Oral Tube size: 7.5 mm Number of attempts: 1 Airway Equipment and Method: Stylet Placement Confirmation: ETT inserted through vocal cords under direct vision,  positive ETCO2 and breath sounds checked- equal and bilateral Secured at: 21 cm Tube secured with: Tape Dental Injury: Teeth and Oropharynx as per pre-operative assessment

## 2015-10-06 NOTE — Procedures (Signed)
RT common  Carotid arteriogram followed by angioplasty of symptomatic RT MCA stenosis  with residual stenosis of less than 10 %

## 2015-10-06 NOTE — Transfer of Care (Signed)
Immediate Anesthesia Transfer of Care Note  Patient: Maria MannerMartha A Dobis  Procedure(s) Performed: Procedure(s): STENTING   (RADIOLOGY WITH ANESTHESIA) (N/A)  Patient Location: PACU  Anesthesia Type:General  Level of Consciousness: awake, alert , oriented and patient cooperative  Airway & Oxygen Therapy: Patient Spontanous Breathing and Patient connected to nasal cannula oxygen  Post-op Assessment: Report given to RN, Post -op Vital signs reviewed and stable and Patient moving all extremities  Post vital signs: Reviewed and stable  Last Vitals:  Filed Vitals:   10/06/15 0703  BP: 192/65  Pulse: 74  Temp: 36.4 C  Resp: 20    Complications: No apparent anesthesia complications

## 2015-10-06 NOTE — Progress Notes (Signed)
ANTICOAGULATION CONSULT NOTE - Follow Up Consult  Pharmacy Consult for Heparin Indication: s/p cerebral angioplasty  No Known Allergies  Patient Measurements: Height: 5\' 5"  (165.1 cm) Weight: 188 lb (85.276 kg) IBW/kg (Calculated) : 57 Heparin Dosing Weight: 75 kg  Vital Signs: Temp: 98.3 F (36.8 C) (03/20 2000) Temp Source: Oral (03/20 2000) BP: 101/54 mmHg (03/20 2200) Pulse Rate: 58 (03/20 2200)  Labs:  Recent Labs  10/06/15 2135  HEPARINUNFRC <0.10*    Estimated Creatinine Clearance: 72.6 mL/min (by C-G formula based on Cr of 0.7).  Assessment:   S/p right MCA angioplasty.   Heparin level is undetectable (< 0.1). Found to be infusing at 500 units/hr, though intent was to increase to 600 units/hr at ~2pm.      Goal of Therapy:  Heparin level 0.1-0.25 units/ml Monitor platelets by anticoagulation protocol: Yes   Plan:   Increase heparin drip to 700 units/hr.  Heparin level and CBC in am.  Heparin drip to stop at 7am on 10/07/15.  Dennie FettersEgan, Tamlyn Sides Donovan, RPh Pager: [phone] 10/06/2015,10:38 PM

## 2015-10-06 NOTE — Sedation Documentation (Signed)
6 Fr. Exoseal to right groin. 

## 2015-10-06 NOTE — Progress Notes (Signed)
ANTICOAGULATION CONSULT NOTE - Initial Consult  Pharmacy Consult for heparin Indication: s/p cerebral angiogram  No Known Allergies  Patient Measurements: Height: 5\' 5"  (165.1 cm) Weight: 188 lb (85.276 kg) IBW/kg (Calculated) : 57 Heparin Dosing Weight: 75kg  Vital Signs: Temp: 97.3 F (36.3 C) (03/20 1330) Temp Source: Oral (03/20 0703) BP: 100/66 mmHg (03/20 1225) Pulse Rate: 68 (03/20 1330)  Labs: No results for input(s): HGB, HCT, PLT, APTT, LABPROT, INR, HEPARINUNFRC, HEPRLOWMOCWT, CREATININE, CKTOTAL, CKMB, TROPONINI in the last 72 hours.  Estimated Creatinine Clearance: 72.6 mL/min (by C-G formula based on Cr of 0.7).   Medical History: Past Medical History  Diagnosis Date  . Hypertension   . Dyslipidemia     takes Atorvastatin daily  . History of pneumonia   . History of bronchitis   . Anxiety   . Urinary frequency   . Urinary incontinence   . Arthritis   . Diabetes mellitus without complication (HCC)     takes Metformin daily  . Depression     takes Zoloft daily  . Hypothyroidism     takes Synthroid daily  . Stroke (HCC)     06/20/15, no deficits    Medications:  Infusions:  . heparin 500 Units/hr (10/06/15 1235)  . lactated ringers 10 mL/hr at 10/06/15 0735   Assessment: 69 yof s/p cerebral angiogram started on IV heparin post-op. Pre-op CBC is WNL. She is only on plavix an aspirin for anticoagulation PTA.   Goal of Therapy:  Heparin level 0.1-0.25 units/ml Monitor platelets by anticoagulation protocol: Yes   Plan:  - Increase heparin gtt to 600 units/hr (~8units/kg/hr) - Check an 8 hour heparin level *Heparin off at 0700 tomorrow  Brandice Busser, Drake Leachachel Lynn 10/06/2015,1:49 PM

## 2015-10-06 NOTE — Progress Notes (Signed)
Referring Physician(s): Shary Decamp  Supervising Physician: Dr Julieanne Cotton  Chief Complaint:    Subjective:  R MCA angioplasty In IR with Dr Norton Blizzard well Resting No complaints   Allergies: Review of patient's allergies indicates no known allergies.  Medications: Prior to Admission medications   Medication Sig Start Date End Date Taking? Authorizing Provider  aspirin 81 MG tablet Take 81 mg by mouth daily.   Yes Historical Provider, MD  atorvastatin (LIPITOR) 40 MG tablet Take 40 mg by mouth 2 (two) times daily.   Yes Historical Provider, MD  clopidogrel (PLAVIX) 75 MG tablet Take 37.5 mg by mouth daily.    Yes Historical Provider, MD  ibuprofen (ADVIL,MOTRIN) 200 MG tablet Take 400 mg by mouth 2 (two) times daily as needed for headache.   Yes Historical Provider, MD  levothyroxine (SYNTHROID, LEVOTHROID) 75 MCG tablet Take 75 mcg by mouth daily before breakfast.   Yes Historical Provider, MD  losartan (COZAAR) 50 MG tablet Take 50 mg by mouth daily.   Yes Historical Provider, MD  meloxicam (MOBIC) 15 MG tablet Take 15 mg by mouth daily.   Yes Historical Provider, MD  metFORMIN (GLUMETZA) 500 MG (MOD) 24 hr tablet Take 1,000 mg by mouth at bedtime.    Yes Historical Provider, MD  oxybutynin (DITROPAN) 5 MG tablet Take 5 mg by mouth 2 (two) times daily. 09/25/15  Yes Historical Provider, MD  sertraline (ZOLOFT) 100 MG tablet Take 100 mg by mouth 2 (two) times daily.   Yes Historical Provider, MD     Vital Signs: BP 100/66 mmHg  Pulse 66  Temp(Src) 97.3 F (36.3 C) (Oral)  Resp 17  Ht  (1.651 m)  Wt 188 lb (85.276 kg)  BMI 31.28 kg/m2  SpO2 100%  Physical Exam  Constitutional: She is oriented to person, place, and time.  Abdominal: Soft.  Musculoskeletal: Normal range of motion.  Neurological: She is oriented to person, place, and time.  Face symmetrical Tongue midline Moves all 4s  Rt groin clean and dry No bleeding No hematoma Rt foor  2+ pulses  Skin: Skin is warm and dry.  Psychiatric: She has a normal mood and affect. Her behavior is normal. Judgment and thought content normal.  Nursing note and vitals reviewed.   Imaging: No results found.  Labs:  CBC:  Recent Labs  07/10/15 1351 08/28/15 0936 10/01/15 1045  WBC 15.1* 7.7 7.6  HGB 12.9 12.4 12.5  HCT 40.2 38.7 39.5  PLT 331 220 181    COAGS:  Recent Labs  07/10/15 1351 08/28/15 0936 10/01/15 1045  INR 1.01 0.98 0.93  APTT BMP:  Recent Labs  07/10/15 1351 08/28/15 0936 10/01/15 1045  NA 139 140 140  K 3.9 3.6 3.8  CL 108 105 103  CO2 GLUCOSE 149* 160* 260*  BUN CALCIUM 9.4 8.7* 9.0  CREATININE 0.78 0.72 0.70  GFRNONAA >60 >60 >60  GFRAA >60 >60 >60    LIVER FUNCTION TESTS:  Recent Labs  08/28/15 0936 10/01/15 1045  BILITOT 0.7 0.4  AST 96* 19  ALT 130* 27  ALKPHOS 181* 96  PROT 6.5 6.3*  ALBUMIN 3.4* 3.4*    Assessment and Plan:  R MCA pta in IR 3/20 Admit overnight Will plan for dc in am  Electronically Signed: Anaelle Dunton A 10/06/2015, 3:42 PM   I spent a total of 15 Minutes at the the patient's bedside  AND on the patient's hospital floor or unit, greater than 50% of which was counseling/coordinating care for R MCA pta

## 2015-10-06 NOTE — Anesthesia Preprocedure Evaluation (Addendum)
Anesthesia Evaluation  Patient identified by MRN, date of birth, ID band Patient awake    Reviewed: Allergy & Precautions, NPO status , Patient's Chart, lab work & pertinent test results  History of Anesthesia Complications Negative for: history of anesthetic complications  Airway Mallampati: II  TM Distance: >3 FB Neck ROM: Full    Dental  (+) Teeth Intact   Pulmonary Current Smoker,    breath sounds clear to auscultation       Cardiovascular hypertension, Pt. on medications (-) angina+ Peripheral Vascular Disease  (-) Past MI and (-) CHF  Rhythm:Regular     Neuro/Psych PSYCHIATRIC DISORDERS Anxiety Depression CVA, No Residual Symptoms    GI/Hepatic negative GI ROS, Neg liver ROS,   Endo/Other  diabetes, Type 2, Oral Hypoglycemic AgentsHypothyroidism   Renal/GU negative Renal ROS     Musculoskeletal  (+) Arthritis ,   Abdominal   Peds  Hematology   Anesthesia Other Findings   Reproductive/Obstetrics                            Anesthesia Physical Anesthesia Plan  ASA: III  Anesthesia Plan: MAC and General   Post-op Pain Management:    Induction: Intravenous  Airway Management Planned: Oral ETT, Natural Airway, Nasal Cannula and Simple Face Mask  Additional Equipment: Arterial line  Intra-op Plan:   Post-operative Plan: Extubation in OR  Informed Consent: I have reviewed the patients History and Physical, chart, labs and discussed the procedure including the risks, benefits and alternatives for the proposed anesthesia with the patient or authorized representative who has indicated his/her understanding and acceptance.   Dental advisory given  Plan Discussed with: CRNA and Surgeon  Anesthesia Plan Comments:         Anesthesia Quick Evaluation

## 2015-10-07 ENCOUNTER — Other Ambulatory Visit: Payer: Self-pay | Admitting: Radiology

## 2015-10-07 ENCOUNTER — Encounter (HOSPITAL_COMMUNITY): Payer: Self-pay | Admitting: Interventional Radiology

## 2015-10-07 DIAGNOSIS — I6601 Occlusion and stenosis of right middle cerebral artery: Secondary | ICD-10-CM

## 2015-10-07 LAB — CBC WITH DIFFERENTIAL/PLATELET
BASOS ABS: 0 10*3/uL (ref 0.0–0.1)
Basophils Relative: 0 %
EOS PCT: 3 %
Eosinophils Absolute: 0.2 10*3/uL (ref 0.0–0.7)
HEMATOCRIT: 31.3 % — AB (ref 36.0–46.0)
Hemoglobin: 9.7 g/dL — ABNORMAL LOW (ref 12.0–15.0)
LYMPHS ABS: 1.4 10*3/uL (ref 0.7–4.0)
LYMPHS PCT: 25 %
MCH: 29 pg (ref 26.0–34.0)
MCHC: 31 g/dL (ref 30.0–36.0)
MCV: 93.4 fL (ref 78.0–100.0)
MONO ABS: 0.3 10*3/uL (ref 0.1–1.0)
Monocytes Relative: 6 %
NEUTROS ABS: 3.7 10*3/uL (ref 1.7–7.7)
Neutrophils Relative %: 66 %
Platelets: 140 10*3/uL — ABNORMAL LOW (ref 150–400)
RBC: 3.35 MIL/uL — AB (ref 3.87–5.11)
RDW: 14.5 % (ref 11.5–15.5)
WBC: 5.6 10*3/uL (ref 4.0–10.5)

## 2015-10-07 LAB — GLUCOSE, CAPILLARY: GLUCOSE-CAPILLARY: 111 mg/dL — AB (ref 65–99)

## 2015-10-07 LAB — BASIC METABOLIC PANEL
ANION GAP: 6 (ref 5–15)
BUN: 12 mg/dL (ref 6–20)
CHLORIDE: 108 mmol/L (ref 101–111)
CO2: 25 mmol/L (ref 22–32)
Calcium: 8.1 mg/dL — ABNORMAL LOW (ref 8.9–10.3)
Creatinine, Ser: 0.59 mg/dL (ref 0.44–1.00)
GFR calc Af Amer: >60 mL/min (ref >60)
GLUCOSE: 111 mg/dL — AB (ref 65–99)
POTASSIUM: 4 mmol/L (ref 3.5–5.1)
Sodium: 139 mmol/L (ref 135–145)

## 2015-10-07 NOTE — Discharge Summary (Signed)
Physician Discharge Summary      Patient ID: Maria Mueller MRN: [id number] DOB/AGE: 69/11/1945 69 y.o.  Admit date: 10/06/2015 Discharge date: 10/07/2015  Admission Diagnoses: Active Problems:   Stenosis of middle cerebral artery  Discharge Diagnoses:  Active Problems:   Stenosis of middle cerebral artery    Procedures: Procedure(s): Cerebral arteriogram with balloon assisted angioplasty of (R)MCA  Discharged Condition: good  Hospital Course: Admitted to Neuro ICU after successful balloon angioplasty of (R)MCA under general anesthesia. No overnight issues. Foley out, voiding well. Has tolerated diet and is OOB/ambulating. POD #1 exam is unremarkable Pt determined stable for discharge. All instructions, home meds, and follow up plans discussed. Strongly encouraged smoking cessation   Consults: None   Discharge Exam: Blood pressure 131/50, pulse 68, temperature 98.5 F (36.9 C), temperature source Oral, resp. rate 9, height 5\' 5"  (1.651 m), weight 188 lb (85.276 kg), SpO2 94 %. Physical Exam  Constitutional: She is oriented to person, place, and time.  Abdominal: Soft.  Musculoskeletal: Normal range of motion.  Neurological: She is oriented to person, place, and time.  Face symmetrical Tongue midline Moves all 4s Fine motor intact, no deficits  Rt groin clean and dry No bleeding No hematoma Rt foor 2+ pulses  Skin: Skin is warm and dry.  Psychiatric: She has a normal mood and affect. Her behavior is normal. Judgment and thought content normal  Disposition: 01-Home or Self Care  Discharge Instructions    Diet - low sodium heart healthy    Complete by:  As directed      Driving Restrictions    Complete by:  As directed   Avoid driving for 3-4 days unless necessary     Increase activity slowly    Complete by:  As directed      May shower / Bathe    Complete by:  As directed      May walk up steps    Complete by:  As directed      Remove dressing in  24 hours    Complete by:  As directed             Medication List    TAKE these medications        aspirin 81 MG tablet  Take 81 mg by mouth daily.     atorvastatin 40 MG tablet  Commonly known as:  LIPITOR  Take 40 mg by mouth 2 (two) times daily.     clopidogrel 75 MG tablet  Commonly known as:  PLAVIX  Take 37.5 mg by mouth daily.     ibuprofen 200 MG tablet  Commonly known as:  ADVIL,MOTRIN  Take 400 mg by mouth 2 (two) times daily as needed for headache.     levothyroxine 75 MCG tablet  Commonly known as:  SYNTHROID, LEVOTHROID  Take 75 mcg by mouth daily before breakfast.     losartan 50 MG tablet  Commonly known as:  COZAAR  Take 50 mg by mouth daily.     meloxicam 15 MG tablet  Commonly known as:  MOBIC  Take 15 mg by mouth daily.     metFORMIN 500 MG (MOD) 24 hr tablet  Commonly known as:  GLUMETZA  Take 1,000 mg by mouth at bedtime.     oxybutynin 5 MG tablet  Commonly known as:  DITROPAN  Take 5 mg by mouth 2 (two) times daily.     sertraline 100 MG tablet  Commonly known as:  ZOLOFT  Take  100 mg by mouth 2 (two) times daily.           Follow-up Information    Follow up with DEVESHWAR, Grandville Silos, MD. Go in 2 weeks.   Specialty:  Interventional Radiology   Why:  Post procedure eval. Office will call with appointment date/time   Contact information:   [address] [place] [address] [phone]       Signed: Brayton El PA-C 10/07/2015, 10:43 AM

## 2015-10-07 NOTE — Anesthesia Postprocedure Evaluation (Signed)
Anesthesia Post Note  Patient: Lenore MannerMartha A Groesbeck  Procedure(s) Performed: Procedure(s) (LRB): STENTING   (RADIOLOGY WITH ANESTHESIA) (N/A)  Patient location during evaluation: PACU Anesthesia Type: General and MAC Level of consciousness: awake Pain management: pain level controlled Vital Signs Assessment: post-procedure vital signs reviewed and stable Respiratory status: spontaneous breathing Cardiovascular status: stable Postop Assessment: no signs of nausea or vomiting Anesthetic complications: no    Last Vitals:  Filed Vitals:   10/07/15 1200 10/07/15 1230  BP: 148/71   Pulse: 64 79  Temp: 36.8 C   Resp: 20 29    Last Pain:  Filed Vitals:   10/07/15 1244  PainSc: 2                  Aleighna Wojtas

## 2015-10-07 NOTE — Progress Notes (Signed)
Patient and friend given and reviewed d/c instructions. Included information on s/s of stroke, stroke prevention, cerebral angiogram. Patient was given tylenol for dull headache and Dr. Corliss Skainseveshwar was made aware. Patient neuro intact. Groin site level 0 pressure dressing intact, R pedal pulse palpable. Patient was dressed, voided, IV removed, and d/c via wheelchair home with friend by NT escort.

## 2015-10-07 NOTE — Care Management Note (Signed)
Case Management Note  Patient Details  Name: Maria Mueller MRN: [id number] Date of Birth: 11/30/1945  Subjective/Objective:    Pt admitted on 10/06/15 s/p Rt carotid arteriogram/angioplasty.  PTA, pt independent of ADLS.                Action/Plan: Pt for dc home today; no dc needs.    Expected Discharge Date:                  Expected Discharge Plan:  Home/Self Care  In-House Referral:     Discharge planning Services  CM Consult  Post Acute Care Choice:    Choice offered to:     DME Arranged:    DME Agency:     HH Arranged:    HH Agency:     Status of Service:  Completed, signed off  Medicare Important Message Given:    Date Medicare IM Given:    Medicare IM give by:    Date Additional Medicare IM Given:    Additional Medicare Important Message give by:     If discussed at Long Length of Stay Meetings, dates discussed:    Additional Comments:  Quintella BatonJulie W. Wilkin Lippy, RN, BSN  Trauma/Neuro ICU Case Manager [phone]

## 2015-10-07 NOTE — Discharge Instructions (Signed)
Cerebral Angiogram, Care After Refer to this sheet in the next few weeks. These instructions provide you with information on caring for yourself after your procedure. Your health care provider may also give you more specific instructions. Your treatment has been planned according to current medical practices, but problems sometimes occur. Call your health care provider if you have any problems or questions after your procedure. WHAT TO EXPECT AFTER THE PROCEDURE After your procedure, it is typical to have the following:  Bruising at the catheter insertion site that usually fades within 1-2 weeks.  Blood collecting in the tissue (hematoma) that may be painful to the touch. It should usually decrease in size and tenderness within 1-2 weeks.  A mild headache. HOME CARE INSTRUCTIONS  Take medicines only as directed by your health care provider.  You may shower 24-48 hours after the procedure or as directed by your health care provider. Remove the bandage (dressing) and gently wash the site with plain soap and water. Pat the area dry with a clean towel. Do not rub the site, because this may cause bleeding.  Do not take baths, swim, or use a hot tub until your health care provider approves.  Check your insertion site every day for redness, swelling, or drainage.  Do not apply powder or lotion to the site.  Do not lift over 10 lb (4.5 kg) for 5 days after your procedure or as directed by your health care provider.  Ask your health care provider when it is okay to:  Return to work or school.  Resume usual physical activities or sports.  Resume sexual activity.  Do not drive home if you are discharged the same day as the procedure. Have someone else drive you.  You may drive 48 hours after the procedure unless otherwise instructed by your health care provider.  Do not operate machinery or power tools for 24 hours after the procedure or as directed by your health care provider.  If your  procedure was done as an outpatient procedure, which means that you went home the same day as your procedure, a responsible adult should be with you for the first 24 hours after you arrive home.  Keep all follow-up visits as directed by your health care provider. This is important. SEEK MEDICAL CARE IF:  You have a fever.  You have chills.  You have increased bleeding from the catheter insertion site. Hold pressure on the site. SEEK IMMEDIATE MEDICAL CARE IF:  You have vision changes or loss of vision.  You have numbness or weakness on one side of your body.  You have difficulty talking, or you have slurred speech or cannot speak (aphasia).  You feel confused or have difficulty remembering.  You have unusual pain at the catheter insertion site.  You have redness, warmth, or swelling at the catheter insertion site.  You have drainage (other than a small amount of blood on the dressing) from the catheter insertion site.  The catheter insertion site is bleeding, and the bleeding does not stop after 30 minutes of holding steady pressure on the site. These symptoms may represent a serious problem that is an emergency. Do not wait to see if the symptoms will go away. Get medical help right away. Call your local emergency services (911 in [place] Do not drive yourself to the hospital.   This information is not intended to replace advice given to you by your health care provider. Make sure you discuss any questions you have with your  health care provider. °  °Document Released: 11/19/2013 Document Revised: 04/23/2014 Document Reviewed: 11/19/2013 °Elsevier Interactive Patient Education ©2016 Elsevier Inc. ° °

## 2015-10-08 ENCOUNTER — Other Ambulatory Visit (HOSPITAL_COMMUNITY): Payer: Self-pay | Admitting: Interventional Radiology

## 2015-10-08 DIAGNOSIS — I771 Stricture of artery: Secondary | ICD-10-CM

## 2015-10-08 DIAGNOSIS — I6389 Other cerebral infarction: Secondary | ICD-10-CM

## 2015-10-23 ENCOUNTER — Ambulatory Visit (HOSPITAL_COMMUNITY): Admission: RE | Admit: 2015-10-23 | Payer: PPO | Source: Ambulatory Visit

## 2015-11-06 ENCOUNTER — Ambulatory Visit (HOSPITAL_COMMUNITY)
Admission: RE | Admit: 2015-11-06 | Discharge: 2015-11-06 | Disposition: A | Discharge status: Home / Self Care | Payer: PPO | Source: Ambulatory Visit | Attending: Radiology | Admitting: Radiology

## 2015-11-06 DIAGNOSIS — Z539 Procedure and treatment not carried out, unspecified reason: Secondary | ICD-10-CM | POA: Diagnosis not present

## 2015-11-06 DIAGNOSIS — I6601 Occlusion and stenosis of right middle cerebral artery: Secondary | ICD-10-CM

## 2015-11-06 LAB — PLATELET INHIBITION P2Y12: Platelet Function  P2Y12: 8 [PRU] — ABNORMAL LOW (ref 194–418)

## 2015-11-07 ENCOUNTER — Telehealth (HOSPITAL_COMMUNITY): Payer: Self-pay | Admitting: *Deleted

## 2015-11-07 NOTE — Telephone Encounter (Signed)
Called patient.  Per Dr. Corliss Skainseveshwar instruction patient is to start taking Plavix 37.5mg  every other day and continue taking ASA 81mg  daily.  Pt to come back in 2 weeks for repeat P2Y12. Patient voiced understanding

## 2015-12-29 ENCOUNTER — Other Ambulatory Visit: Payer: Self-pay | Admitting: Radiology

## 2015-12-29 LAB — PLATELET INHIBITION P2Y12: PLATELET FUNCTION P2Y12: 113 [PRU] — AB (ref 194–418)

## 2016-01-02 ENCOUNTER — Telehealth (HOSPITAL_COMMUNITY): Payer: Self-pay | Admitting: *Deleted

## 2016-01-02 NOTE — Telephone Encounter (Signed)
Call patient and verified medication regmine she takes Plavix 37.5mg  every other day and ASA 81mg  daily, per Dr. Corliss Skainseveshwar she is to continue this dose. Pt voiced understanding.  She is to have LFT's checked at her primary MD's office.

## 2016-01-06 ENCOUNTER — Other Ambulatory Visit (HOSPITAL_COMMUNITY): Payer: Self-pay | Admitting: Interventional Radiology

## 2016-01-06 DIAGNOSIS — I771 Stricture of artery: Secondary | ICD-10-CM

## 2016-01-12 ENCOUNTER — Other Ambulatory Visit: Payer: Self-pay | Admitting: Radiology

## 2016-01-13 ENCOUNTER — Encounter (HOSPITAL_COMMUNITY): Payer: Self-pay

## 2016-01-13 ENCOUNTER — Ambulatory Visit (HOSPITAL_COMMUNITY)
Admission: RE | Admit: 2016-01-13 | Discharge: 2016-01-13 | Disposition: A | Discharge status: Home / Self Care | Payer: PPO | Source: Ambulatory Visit | Attending: Interventional Radiology | Admitting: Interventional Radiology

## 2016-01-13 DIAGNOSIS — Z48812 Encounter for surgical aftercare following surgery on the circulatory system: Secondary | ICD-10-CM | POA: Diagnosis present

## 2016-01-13 DIAGNOSIS — I771 Stricture of artery: Secondary | ICD-10-CM

## 2016-01-13 DIAGNOSIS — M1990 Unspecified osteoarthritis, unspecified site: Secondary | ICD-10-CM | POA: Insufficient documentation

## 2016-01-13 DIAGNOSIS — E785 Hyperlipidemia, unspecified: Secondary | ICD-10-CM | POA: Diagnosis not present

## 2016-01-13 DIAGNOSIS — E119 Type 2 diabetes mellitus without complications: Secondary | ICD-10-CM | POA: Diagnosis not present

## 2016-01-13 DIAGNOSIS — Z7982 Long term (current) use of aspirin: Secondary | ICD-10-CM | POA: Diagnosis not present

## 2016-01-13 DIAGNOSIS — F419 Anxiety disorder, unspecified: Secondary | ICD-10-CM | POA: Insufficient documentation

## 2016-01-13 DIAGNOSIS — Z8673 Personal history of transient ischemic attack (TIA), and cerebral infarction without residual deficits: Secondary | ICD-10-CM | POA: Insufficient documentation

## 2016-01-13 DIAGNOSIS — E039 Hypothyroidism, unspecified: Secondary | ICD-10-CM | POA: Diagnosis not present

## 2016-01-13 DIAGNOSIS — Z7984 Long term (current) use of oral hypoglycemic drugs: Secondary | ICD-10-CM | POA: Insufficient documentation

## 2016-01-13 DIAGNOSIS — Z7902 Long term (current) use of antithrombotics/antiplatelets: Secondary | ICD-10-CM | POA: Insufficient documentation

## 2016-01-13 DIAGNOSIS — I6601 Occlusion and stenosis of right middle cerebral artery: Secondary | ICD-10-CM | POA: Diagnosis not present

## 2016-01-13 DIAGNOSIS — F17210 Nicotine dependence, cigarettes, uncomplicated: Secondary | ICD-10-CM | POA: Insufficient documentation

## 2016-01-13 DIAGNOSIS — F329 Major depressive disorder, single episode, unspecified: Secondary | ICD-10-CM | POA: Diagnosis not present

## 2016-01-13 DIAGNOSIS — R32 Unspecified urinary incontinence: Secondary | ICD-10-CM | POA: Diagnosis not present

## 2016-01-13 DIAGNOSIS — I10 Essential (primary) hypertension: Secondary | ICD-10-CM | POA: Insufficient documentation

## 2016-01-13 LAB — BASIC METABOLIC PANEL
ANION GAP: 7 (ref 5–15)
BUN: 18 mg/dL (ref 6–20)
CALCIUM: 9.5 mg/dL (ref 8.9–10.3)
CO2: 28 mmol/L (ref 22–32)
CREATININE: 0.93 mg/dL (ref 0.44–1.00)
Chloride: 106 mmol/L (ref 101–111)
GFR calc Af Amer: >60 mL/min (ref >60)
GLUCOSE: 126 mg/dL — AB (ref 65–99)
POTASSIUM: 4.2 mmol/L (ref 3.5–5.1)
Sodium: 141 mmol/L (ref 135–145)

## 2016-01-13 LAB — CBC WITH DIFFERENTIAL/PLATELET
BASOS ABS: 0 10*3/uL (ref 0.0–0.1)
BASOS PCT: 0 %
EOS ABS: 0.3 10*3/uL (ref 0.0–0.7)
Eosinophils Relative: 4 %
HCT: 42.8 % (ref 36.0–46.0)
HEMOGLOBIN: 13.5 g/dL (ref 12.0–15.0)
Lymphocytes Relative: 28 %
Lymphs Abs: 2 10*3/uL (ref 0.7–4.0)
MCH: 29 pg (ref 26.0–34.0)
MCHC: 31.5 g/dL (ref 30.0–36.0)
MCV: 92 fL (ref 78.0–100.0)
MONOS PCT: 8 %
Monocytes Absolute: 0.6 10*3/uL (ref 0.1–1.0)
NEUTROS PCT: 60 %
Neutro Abs: 4.2 10*3/uL (ref 1.7–7.7)
Platelets: 203 10*3/uL (ref 150–400)
RBC: 4.65 MIL/uL (ref 3.87–5.11)
RDW: 15.1 % (ref 11.5–15.5)
WBC: 7 10*3/uL (ref 4.0–10.5)

## 2016-01-13 LAB — GLUCOSE, CAPILLARY
Glucose-Capillary: 131 mg/dL — ABNORMAL HIGH (ref 65–99)
Glucose-Capillary: 121 mg/dL — ABNORMAL HIGH (ref 65–99)

## 2016-01-13 LAB — PROTIME-INR
INR: 1.03 (ref 0.00–1.49)
PROTHROMBIN TIME: 13.7 s (ref 11.6–15.2)

## 2016-01-13 MED ORDER — HEPARIN SODIUM (PORCINE) 1000 UNIT/ML IJ SOLN
INTRAMUSCULAR | Status: AC
  Filled 2016-01-13: qty 2

## 2016-01-13 MED ORDER — MIDAZOLAM HCL 2 MG/2ML IJ SOLN
INTRAMUSCULAR | Status: AC
  Filled 2016-01-13: qty 2

## 2016-01-13 MED ORDER — IOPAMIDOL (ISOVUE-300) INJECTION 61%
INTRAVENOUS | Status: AC
  Administered 2016-01-13: 65 mL
  Filled 2016-01-13: qty 150

## 2016-01-13 MED ORDER — HEPARIN SODIUM (PORCINE) 1000 UNIT/ML IJ SOLN
INTRAMUSCULAR | Status: AC | PRN
  Administered 2016-01-13: 1000 [IU] via INTRAVENOUS

## 2016-01-13 MED ORDER — FENTANYL CITRATE (PF) 100 MCG/2ML IJ SOLN
INTRAMUSCULAR | Status: AC
  Filled 2016-01-13: qty 2

## 2016-01-13 MED ORDER — FENTANYL CITRATE (PF) 100 MCG/2ML IJ SOLN
INTRAMUSCULAR | Status: AC | PRN
  Administered 2016-01-13: 25 ug via INTRAVENOUS

## 2016-01-13 MED ORDER — LIDOCAINE HCL 1 % IJ SOLN
INTRAMUSCULAR | Status: AC
  Administered 2016-01-13: 10 mL
  Filled 2016-01-13: qty 20

## 2016-01-13 MED ORDER — SODIUM CHLORIDE 0.9 % IV SOLN: INTRAVENOUS | Status: DC

## 2016-01-13 MED ORDER — MIDAZOLAM HCL 2 MG/2ML IJ SOLN
INTRAMUSCULAR | Status: AC | PRN
  Administered 2016-01-13: 1 mg via INTRAVENOUS

## 2016-01-13 MED ORDER — SODIUM CHLORIDE 0.9 % IV SOLN: INTRAVENOUS | Status: AC

## 2016-01-13 NOTE — Sedation Documentation (Signed)
Patient denies pain and is resting comfortably.  

## 2016-01-13 NOTE — Discharge Instructions (Signed)
NO GLUMETZA  METFORMIN/GLUCOPHAGE FOR 2 DAYS    Angiogram, Care After These instructions give you information about caring for yourself after your procedure. Your doctor may also give you more specific instructions. Call your doctor if you have any problems or questions after your procedure.  HOME CARE  Take medicines only as told by your doctor.  Follow your doctor's instructions about:  Care of the area where the tube was inserted.  Bandage (dressing) changes and removal.  You may shower 24-48 hours after the procedure or as told by your doctor.  Do not take baths, swim, or use a hot tub until your doctor approves.  Every day, check the area where the tube was inserted. Watch for:  Redness, swelling, or pain.  Fluid, blood, or pus.  Do not apply powder or lotion to the site.  Do not lift anything that is heavier than 10 lb (4.5 kg) for 5 days or as told by your doctor.  Ask your doctor when you can:  Return to work or school.  Do physical activities or play sports.  Have sex.  Do not drive or operate heavy machinery for 24 hours or as told by your doctor.  Have someone with you for the first 24 hours after the procedure.  Keep all follow-up visits as told by your doctor. This is important. GET HELP IF:  You have a fever.   You have chills.   You have more bleeding from the area where the tube was inserted. Hold pressure on the area.  You have redness, swelling, or pain in the area where the tube was inserted.  You have fluid or pus coming from the area. GET HELP RIGHT AWAY IF:   You have a lot of pain in the area where the tube was inserted.  The area where the tube was inserted is bleeding, and the bleeding does not stop after 30 minutes of holding steady pressure on the area.  The area near or just beyond the insertion site becomes pale, cool, tingly, or numb.   This information is not intended to replace advice given to you by your health care  provider. Make sure you discuss any questions you have with your health care provider.   Document Released: 10/01/2008 Document Revised: 07/26/2014 Document Reviewed: 12/06/2012 Elsevier Interactive Patient Education [employer]2016 Elsevier Inc.

## 2016-01-13 NOTE — Procedures (Signed)
S/P bilateral common carotid and RT Vert artery angiograms. RT CFA approach . Findings  1.<10 %stenosis of previously angioplastied RT M1. 2.Interval progression  Of stenosis of superior  division of RT MCA to 50%

## 2016-01-13 NOTE — Sedation Documentation (Addendum)
O2 tubing removed per MD request. CO2 monitoring unavailable.

## 2016-01-13 NOTE — Sedation Documentation (Signed)
Patient is resting comfortably. 

## 2016-01-13 NOTE — H&P (Signed)
Chief Complaint: Severe right middle cerebral artery M1 stenosis  Supervising Physician: Julieanne Cotton  Patient Status: Outpatient  History of Present Illness: Maria Mueller is a 70 y.o. female right-handed lady who is status post endovascular revascularization of symptomatic severe right middle cerebral artery M1 stenoses back on October 13, 2015.  She reports no symptoms since she last saw Dr. Corliss Skains in April.   She denies any speech difficulties, facial weakness, or paresthesias in the contralateral left arm and leg, seizures, or loss of consciousness.  She denies any visual symptoms of diplopia, blurring or blindness.  She reports occasional gait disturbance, like she is "veering off the right" otherwise, no falls or pain on walking. She walks with a cane for "security".  She reports no nausea, vomiting or perioral numbness or swallowing difficulties or of pulsatile tinnitus.  She is here today for follow up diagnostic cerebral angiography.  She has continued her Plavix and Aspirin as directed by Dr. Corliss Skains.  She does take Metformin and I have advised her to hold this x 2 doses since she will have contrast today.  She is NPO.  Past Medical History  Diagnosis Date  . Hypertension   . Dyslipidemia     takes Atorvastatin daily  . History of pneumonia   . History of bronchitis   . Anxiety   . Urinary frequency   . Urinary incontinence   . Arthritis   . Diabetes mellitus without complication (HCC)     takes Metformin daily  . Depression     takes Zoloft daily  . Hypothyroidism     takes Synthroid daily  . Stroke (HCC)     06/20/15, no deficits    Past Surgical History  Procedure Laterality Date  . Bilateral carpal tunnel release    . Elbow surgery Right   . Shoulder surgery Right   . Knee arthroplasty Right   . Tumor excision      abdominal  . Abdominal hysterectomy    . Radiology with anesthesia N/A 07/16/2015    Procedure: Angioplasty  with Stenting;  Surgeon: Julieanne Cotton, MD;  Location: [facility] OR;  Service: Radiology;  Laterality: N/A;  . Trigger finger release Right many years ago     thumb  . Radiology with anesthesia N/A 10/06/2015    Procedure: STENTING   (RADIOLOGY WITH ANESTHESIA);  Surgeon: Julieanne Cotton, MD;  Location: [facility] OR;  Service: Radiology;  Laterality: N/A;    Allergies: Review of patient's allergies indicates no known allergies.  Medications: Prior to Admission medications   Medication Sig Start Date End Date Taking? Authorizing Provider  aspirin 81 MG tablet Take 81 mg by mouth daily.   Yes Historical Provider, MD  atorvastatin (LIPITOR) 40 MG tablet Take 40 mg by mouth 2 (two) times daily.   Yes Historical Provider, MD  cholecalciferol (VITAMIN D) 1000 units tablet Take 1,000 Units by mouth daily.   Yes Historical Provider, MD  clopidogrel (PLAVIX) 75 MG tablet Take 37.5 mg by mouth every other day.    Yes Historical Provider, MD  levothyroxine (SYNTHROID, LEVOTHROID) 75 MCG tablet Take 75 mcg by mouth daily before breakfast.   Yes Historical Provider, MD  losartan (COZAAR) 50 MG tablet Take 50 mg by mouth daily.   Yes Historical Provider, MD  meloxicam (MOBIC) 15 MG tablet Take 15 mg by mouth daily.   Yes Historical Provider, MD  metFORMIN (GLUMETZA) 500 MG (MOD) 24 hr tablet Take 1,000 mg by mouth at bedtime.  Yes Historical Provider, MD  oxybutynin (DITROPAN) 5 MG tablet Take 5 mg by mouth 2 (two) times daily. 09/25/15  Yes Historical Provider, MD  sertraline (ZOLOFT) 100 MG tablet Take 100 mg by mouth 2 (two) times daily.   Yes Historical Provider, MD  ibuprofen (ADVIL,MOTRIN) 200 MG tablet Take 400 mg by mouth 2 (two) times daily as needed for headache.    Historical Provider, MD     History reviewed. No pertinent family history.  Social History   Social History  . Marital Status: Married    Spouse Name: N/A  . Number of Children: N/A  . Years of Education: N/A   Social History Main  Topics  . Smoking status: Current Every Day Smoker -- 0.50 packs/day for 49 years    Types: Cigarettes  . Smokeless tobacco: None     Comment: smoking cessation info given  . Alcohol Use: No  . Drug Use: No  . Sexual Activity: Not Asked   Other Topics Concern  . None   Social History Narrative     Review of Systems: A 12 point ROS discussed  Review of Systems  Constitutional: Negative for fever, chills, activity change, appetite change and fatigue.  HENT: Negative.   Respiratory: Negative for cough, shortness of breath and wheezing.   Cardiovascular: Negative for chest pain.  Gastrointestinal: Negative for nausea, vomiting and abdominal pain.  Genitourinary: Negative.   Musculoskeletal: Negative.   Skin: Negative.   Neurological: Negative for dizziness, syncope, speech difficulty, weakness, light-headedness and numbness.  Hematological: Negative.   Psychiatric/Behavioral: Negative.     Vital Signs: BP 162/80 mmHg  Temp(Src) 97.9 F (36.6 C) (Oral)  Resp 16  Ht 5\' 5"  (1.651 m)  Wt 188 lb (85.276 kg)  BMI 31.28 kg/m2  SpO2 100%  Physical Exam  Constitutional: She is oriented to person, place, and time. She appears well-developed and well-nourished.  HENT:  Head: Normocephalic and atraumatic.  Eyes: EOM are normal.  Neck: Normal range of motion. Neck supple.  Cardiovascular: Normal rate, regular rhythm and normal heart sounds.   No murmur heard. Pulmonary/Chest: Effort normal and breath sounds normal.  Abdominal: Soft. Bowel sounds are normal.  Musculoskeletal: Normal range of motion.  Neurological: She is alert and oriented to person, place, and time. No cranial nerve deficit.  Skin: Skin is warm and dry.  Psychiatric: She has a normal mood and affect. Her behavior is normal. Judgment and thought content normal.  Vitals reviewed.   Mallampati Score:  MD Evaluation Airway: WNL Heart: WNL Abdomen: WNL Chest/ Lungs: WNL ASA  Classification:  2 Mallampati/Airway Score: Two  Imaging: No results found.  Labs:  CBC:  Recent Labs  08/28/15 0936 10/01/15 1045 10/07/15 0520 01/13/16 0725  WBC 7.7 7.6 5.6 7.0  HGB 12.4 12.5 9.7* 13.5  HCT 38.7 39.5 31.3* 42.8  PLT 220 181 140* 203    COAGS:  Recent Labs  07/10/15 1351 08/28/15 0936 10/01/15 1045 01/13/16 0725  INR 1.01 0.98 0.93 1.03  APTT 28 29 28   --     BMP:  Recent Labs  08/28/15 0936 10/01/15 1045 10/07/15 0520 01/13/16 0725  NA 140 140 139 141  K 3.6 3.8 4.0 4.2  CL 105 103 108 106  CO2 25 27 25 28   GLUCOSE 160* 260* 111* 126*  BUN 10 13 12 18   CALCIUM 8.7* 9.0 8.1* 9.5  CREATININE 0.72 0.70 0.59 0.93  GFRNONAA >60 >60 >60 >60  GFRAA >60 >60 >60 >60  LIVER FUNCTION TESTS:  Recent Labs  08/28/15 0936 10/01/15 1045  BILITOT 0.7 0.4  AST 96* 19  ALT 130* 27  ALKPHOS 181* 96  PROT 6.5 6.3*  ALBUMIN 3.4* 3.4*    TUMOR MARKERS: No results for input(s): AFPTM, CEA, CA199, CHROMGRNA in the last 8760 hours.  Assessment and Plan:  Status post endovascular revascularization of symptomatic severe right middle cerebral artery M1 stenoses back on October 13, 2015.  Will proceed with follow up diagnostic cerebral angiography today by Dr. Corliss Skainseveshwar.  Risks and Benefits discussed with the patient including, but not limited to bleeding, infection, vascular injury, contrast induced renal failure, stroke or even death.  All of the patient's questions were answered, patient is agreeable to proceed. Consent signed and in chart.  Electronically Signed: Gwynneth MacleodWENDY S Braylyn Eye PA-C 01/13/2016, 8:10 AM   I spent a total of  25 Minutes in face to face in clinical consultation, greater than 50% of which was counseling/coordinating care for cerebral angiography.

## 2016-02-03 ENCOUNTER — Telehealth (HOSPITAL_COMMUNITY): Payer: Self-pay

## 2016-02-03 NOTE — Telephone Encounter (Signed)
Pt is having gallbladder surgery soon and will need to stop plavix prior to procedure. Informed pt that I would leave a message for Dr. Corliss Skainseveshwar and get back with her as soon as he responds. Pt agreed with this plan. AW

## 2016-05-11 ENCOUNTER — Telehealth (HOSPITAL_COMMUNITY): Payer: Self-pay

## 2016-05-11 NOTE — Telephone Encounter (Signed)
Called to schedule 3 month f/u ct, left message for pt to return call. AW

## 2016-06-17 ENCOUNTER — Other Ambulatory Visit (HOSPITAL_COMMUNITY): Payer: Self-pay | Admitting: Interventional Radiology

## 2016-06-17 DIAGNOSIS — I771 Stricture of artery: Secondary | ICD-10-CM

## 2016-06-25 ENCOUNTER — Ambulatory Visit (HOSPITAL_COMMUNITY)
Admission: RE | Admit: 2016-06-25 | Discharge: 2016-06-25 | Disposition: A | Discharge status: Home / Self Care | Payer: PPO | Source: Ambulatory Visit | Attending: Interventional Radiology | Admitting: Interventional Radiology

## 2016-06-25 ENCOUNTER — Ambulatory Visit (HOSPITAL_COMMUNITY): Payer: PPO

## 2016-06-25 DIAGNOSIS — I771 Stricture of artery: Secondary | ICD-10-CM | POA: Diagnosis present

## 2016-06-25 DIAGNOSIS — I6623 Occlusion and stenosis of bilateral posterior cerebral arteries: Secondary | ICD-10-CM | POA: Diagnosis not present

## 2016-06-25 DIAGNOSIS — I6613 Occlusion and stenosis of bilateral anterior cerebral arteries: Secondary | ICD-10-CM | POA: Insufficient documentation

## 2016-06-25 DIAGNOSIS — I6601 Occlusion and stenosis of right middle cerebral artery: Secondary | ICD-10-CM | POA: Diagnosis not present

## 2016-06-25 LAB — POCT I-STAT CREATININE: Creatinine, Ser: 0.2 mg/dL — ABNORMAL LOW (ref 0.44–1.00)

## 2016-06-25 LAB — POCT ACTIVATED CLOTTING TIME: ACTIVATED CLOTTING TIME: 120 s

## 2016-06-25 MED ORDER — IOPAMIDOL (ISOVUE-370) INJECTION 76%
INTRAVENOUS | Status: AC
  Administered 2016-06-25: 50 mL
  Filled 2016-06-25: qty 50

## 2016-07-01 ENCOUNTER — Ambulatory Visit (HOSPITAL_COMMUNITY): Payer: PPO

## 2016-07-22 ENCOUNTER — Other Ambulatory Visit (HOSPITAL_COMMUNITY): Payer: Self-pay | Admitting: Interventional Radiology

## 2016-07-22 DIAGNOSIS — I771 Stricture of artery: Secondary | ICD-10-CM

## 2016-07-30 ENCOUNTER — Ambulatory Visit (HOSPITAL_COMMUNITY): Admission: RE | Admit: 2016-07-30 | Payer: PPO | Source: Ambulatory Visit

## 2016-08-11 ENCOUNTER — Ambulatory Visit (HOSPITAL_COMMUNITY)
Admission: RE | Admit: 2016-08-11 | Discharge: 2016-08-11 | Disposition: A | Discharge status: Home / Self Care | Payer: Medicare HMO | Source: Ambulatory Visit | Attending: Interventional Radiology | Admitting: Interventional Radiology

## 2016-08-11 ENCOUNTER — Encounter (HOSPITAL_COMMUNITY): Payer: Self-pay | Admitting: Radiology

## 2016-08-11 DIAGNOSIS — I771 Stricture of artery: Secondary | ICD-10-CM

## 2016-08-11 HISTORY — PX: IR GENERIC HISTORICAL: IMG1180011

## 2016-08-13 ENCOUNTER — Encounter (HOSPITAL_COMMUNITY): Payer: Self-pay | Admitting: Interventional Radiology

## 2017-09-03 IMAGING — XA IR ANGIO INTRA EXTRACRAN SEL COM CAROTID INNOMINATE BILAT MOD SE
3 series · 4 of 4 positions shown · IV contrast (IODINE)
Comparison: none

CLINICAL DATA: History of symptomatic right middle cerebral artery
stenosis. Status post balloon angioplasty.

[Series 3: carotid · 1 of 1 slices shown (1 of 3)]
[im 1/1]
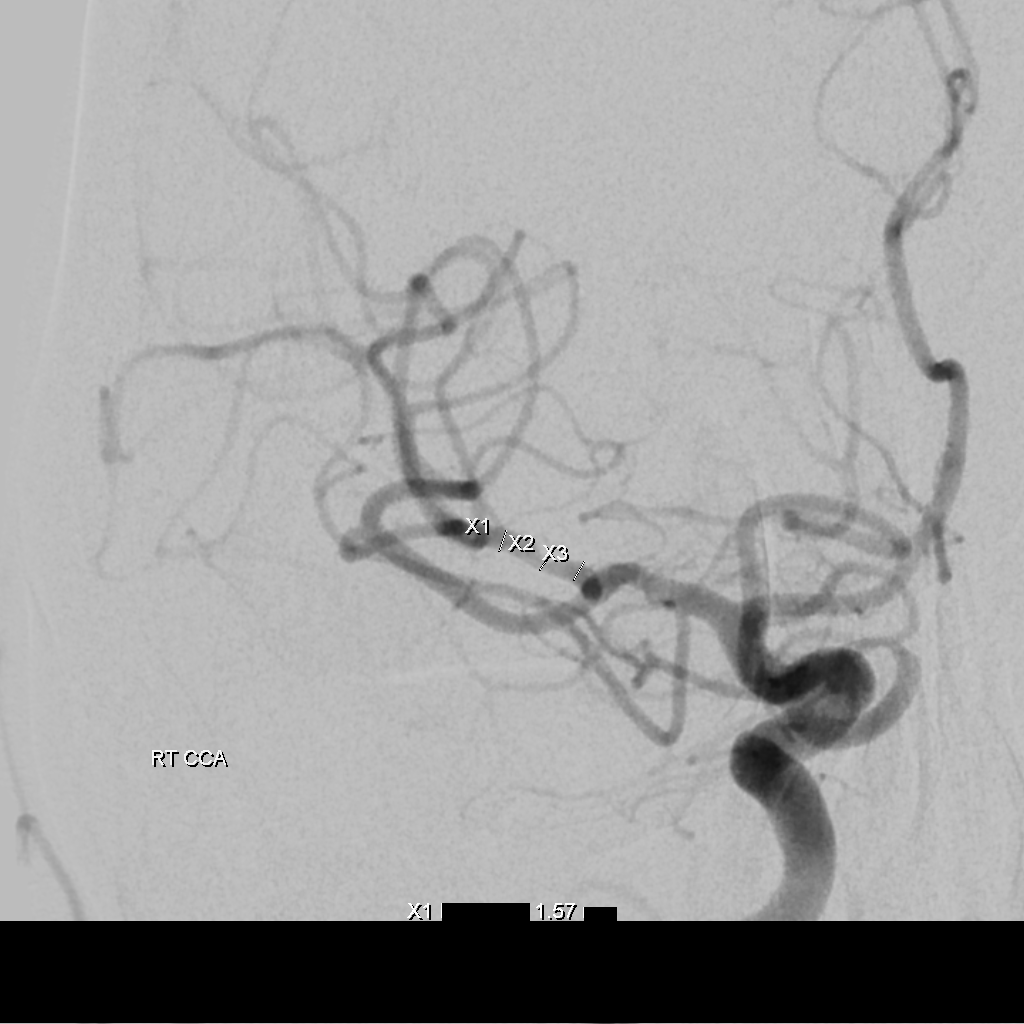

[Series 5: carotid · 1 of 1 slices shown (2 of 3)]
[im 1/1]
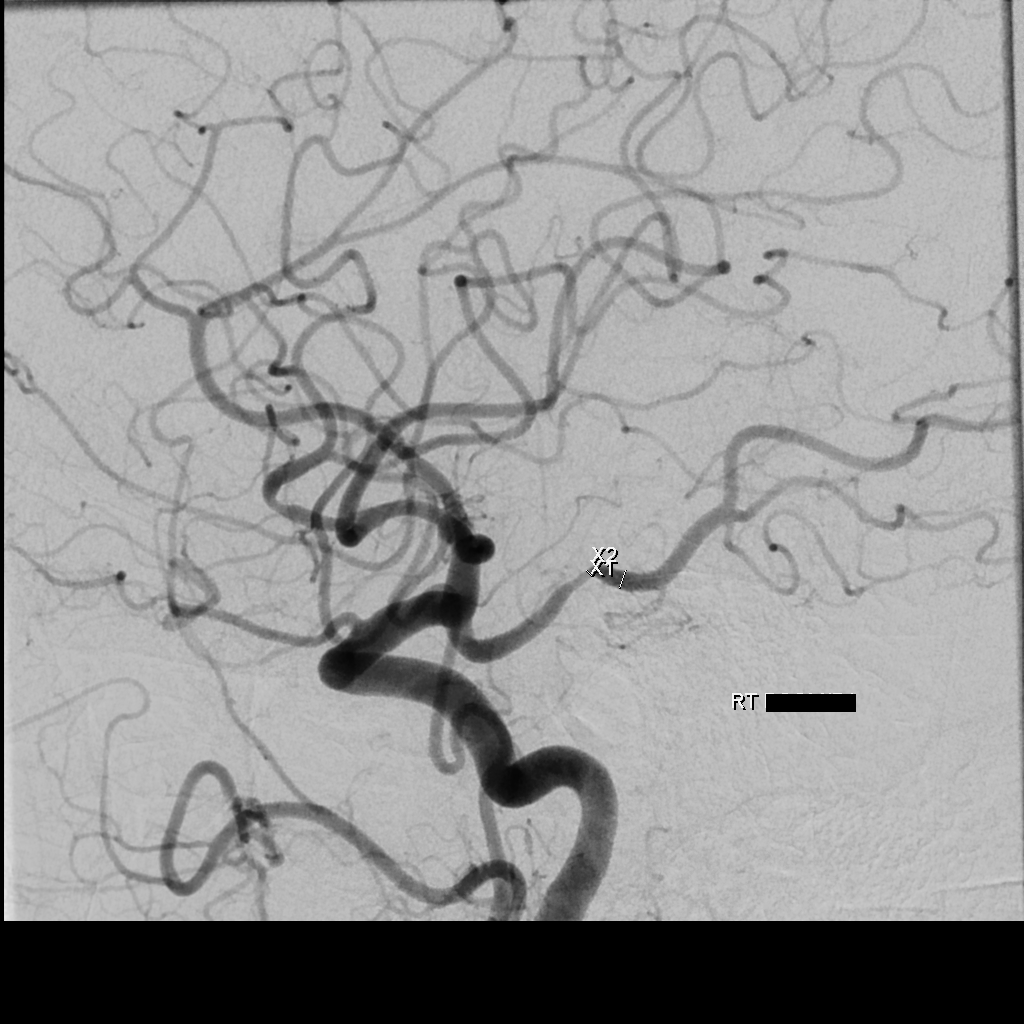

[Series 8: carotid · 2 of 2 slices shown (3 of 3)]
[im 1/2]
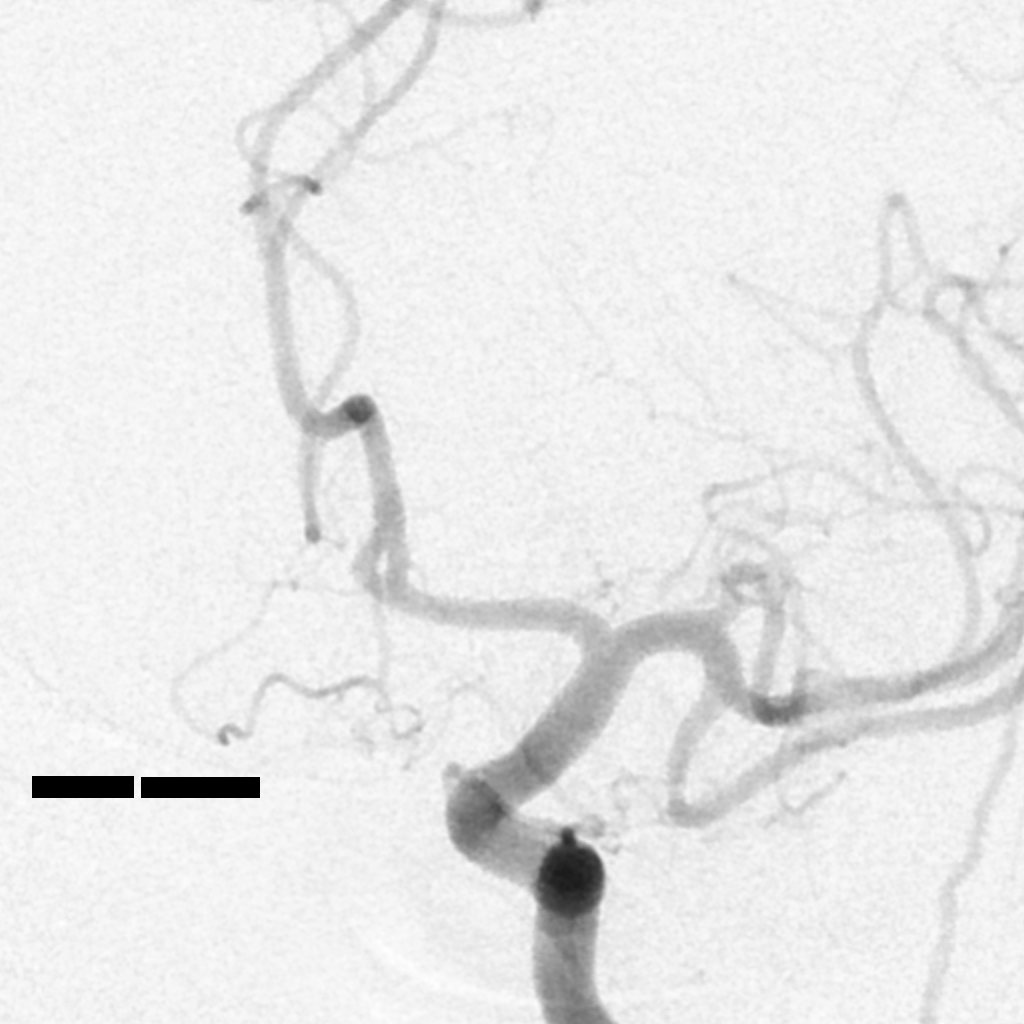
[im 2/2]
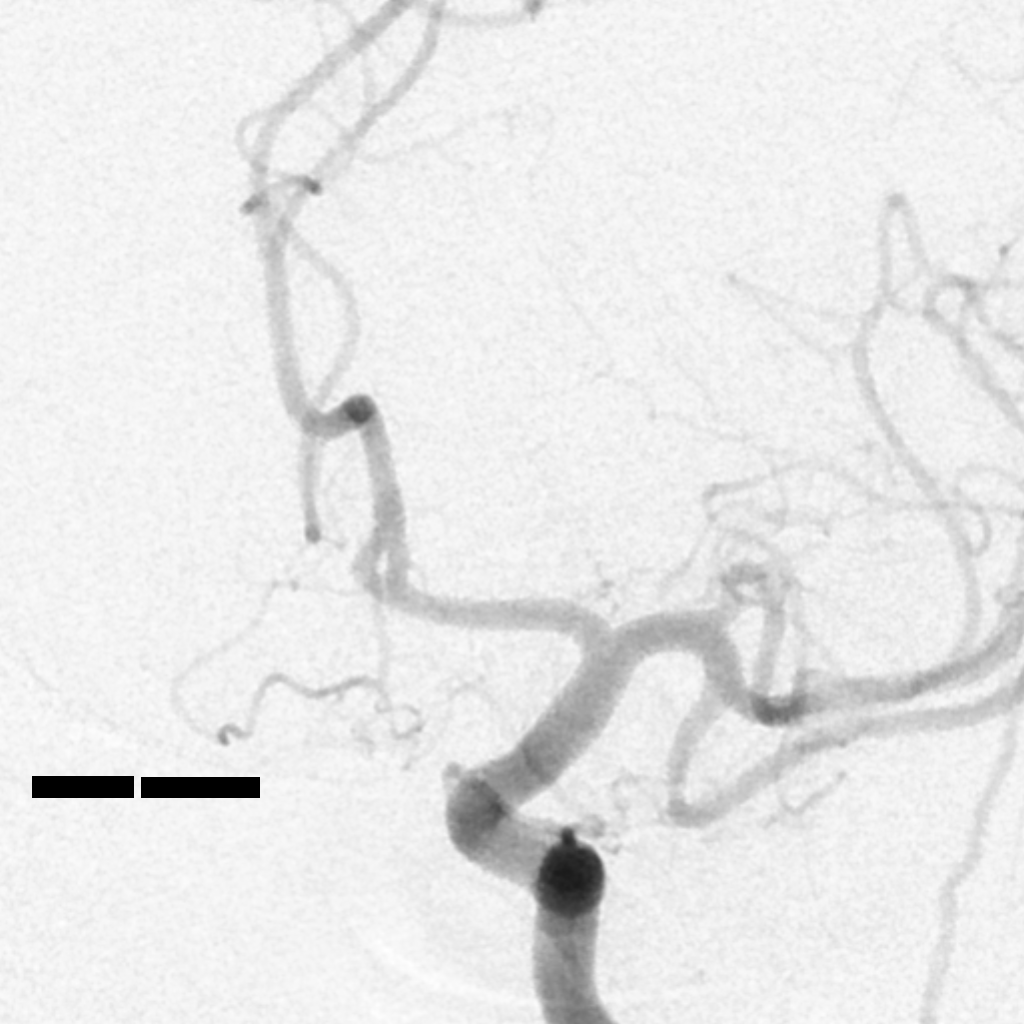

[4 of 4 positions shown; findings below may reference images not displayed]

EXAM:
BILATERAL COMMON CAROTID AND INNOMINATE ANGIOGRAPHY AND RIGHT
VERTEBRAL ARTERY ANGIOGRAMS:

PROCEDURE:
Contrast: 65mL 7NOG1P-U99 IOPAMIDOL (7NOG1P-U99) INJECTION 61%

Anesthesia/Sedation:  Conscious sedation.

Medications: Versed 1 mg IV.  Fentanyl 25 mcg IV.

Following a full explanation of the procedure along with the
potential associated complications, an informed witnessed consent
was obtained.

The right groin was prepped and draped in the usual sterile fashion.
Thereafter using modified Seldinger technique, transfemoral access
into the right common femoral artery was obtained without
difficulty. Over a 0.035 inch guidewire, a 5 French Pinnacle sheath
was inserted. Through this, and also over 0.035 inch guidewire, a 5
French JB 1 catheter was advanced to the aortic arch region and
selectively positioned in the right common carotid artery,
innominate artery, the left common carotid artery.

There were no acute complications. The patient tolerated the
procedure well.
FINDINGS: The origin of the right vertebral artery is normal.

The vessel demonstrates mild tortuosity proximally. More distally
the vessel is seen to opacify normally to the cranial skull base to
the right posterior-inferior cerebellar artery and the proximal
right vertebrobasilar junction.

The right common carotid arteriogram demonstrates the right external
carotid artery and its major branches to be widely patent.

The right internal carotid artery at the bulb to the cranial skull
base opacifies normally. The petrous, cavernous and the supraclinoid
segments are widely patent.

The right posterior communicating artery is seen opacifying the
right posterior cerebral artery distribution and the right superior
cerebellar artery distribution. There is approximately 50-60%
stenosis at the junction of the right posterior communicating artery
with the right P1 segment.

The right middle cerebral artery proximally demonstrates wide
patency with a less than 10% stenosis at the site of the previously
angioplastied segment and the M1 segment.

Inferior division opacifies normally into the capillary and the
venous phases.

The superior division has approximately 50% stenosis though there is
a free antegrade flow into the distal distribution.

The right anterior cerebral artery opacifies normally into the
capillary and the venous phases.

The left common carotid arteriogram demonstrates the left external
carotid artery and its major branches to be normal.

Left internal carotid artery at the bulb through the cranial skull
base opacifies normally.

The petrous, cavernous and the supraclinoid segments demonstrate
wide patency with mild fusiform dilatation of the caval cavernous
segment.

Left middle cerebral artery and the left anterior cerebral artery
opacify normally into the capillary and the venous phases.

A fenestration of the left anterior cerebral artery A1-A2 junction
is noted.
IMPRESSION: Less than 10% stenosis at the previously angioplastied right M1
segment.

Interval progression of atherosclerotic narrowing of the superior
division of the right middle cerebral artery to 50%.

Approximately 50-60% stenosis of the right posterior communicating
artery/posterior cerebral artery P1 junction.

Fenestration of the left anterior cerebral artery A1/ACOM junction.
This represents developmental variation.

The angiographic findings were reviewed with the patient. She was
again advised to stop smoking and to continue to take her statins
and also her manage her diabetes.

A follow-up CT angiogram will be performed in approximately 3-4
months. Should the patient develop symptoms of left-sided weakness,
numbness, tingling, incoordination or facial droop, she has been
asked to call emergency for prompt attention. Patient leaves with
good understanding and agreement with above management plan.
